# Patient Record
Sex: Female | Born: 1990 | Race: White | Hispanic: No | Marital: Married | Smoking: Never smoker
Health system: Southern US, Community
[De-identification: ages and names within clinical notes are randomized; demographics above are authoritative.]

## PROBLEM LIST (undated history)

## (undated) ENCOUNTER — Inpatient Hospital Stay (HOSPITAL_COMMUNITY): Payer: Self-pay

## (undated) DIAGNOSIS — F909 Attention-deficit hyperactivity disorder, unspecified type: Secondary | ICD-10-CM

## (undated) DIAGNOSIS — F32A Depression, unspecified: Secondary | ICD-10-CM

## (undated) DIAGNOSIS — O1205 Gestational edema, complicating the puerperium: Secondary | ICD-10-CM

## (undated) DIAGNOSIS — O139 Gestational [pregnancy-induced] hypertension without significant proteinuria, unspecified trimester: Secondary | ICD-10-CM

## (undated) DIAGNOSIS — I10 Essential (primary) hypertension: Secondary | ICD-10-CM

## (undated) DIAGNOSIS — F329 Major depressive disorder, single episode, unspecified: Secondary | ICD-10-CM

## (undated) DIAGNOSIS — K219 Gastro-esophageal reflux disease without esophagitis: Secondary | ICD-10-CM

## (undated) DIAGNOSIS — F419 Anxiety disorder, unspecified: Secondary | ICD-10-CM

## (undated) DIAGNOSIS — T8859XA Other complications of anesthesia, initial encounter: Secondary | ICD-10-CM

## (undated) DIAGNOSIS — T4145XA Adverse effect of unspecified anesthetic, initial encounter: Secondary | ICD-10-CM

## (undated) DIAGNOSIS — B977 Papillomavirus as the cause of diseases classified elsewhere: Secondary | ICD-10-CM

## (undated) DIAGNOSIS — Z8669 Personal history of other diseases of the nervous system and sense organs: Secondary | ICD-10-CM

## (undated) HISTORY — DX: Attention-deficit hyperactivity disorder, unspecified type: F90.9

## (undated) HISTORY — PX: WISDOM TOOTH EXTRACTION: SHX21

## (undated) HISTORY — PX: FINGER SURGERY: SHX640

---
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
## 2009-02-14 ENCOUNTER — Emergency Department (HOSPITAL_COMMUNITY): Admission: EM | Admit: 2009-02-14 | Discharge: 2009-02-15 | Payer: Self-pay | Admitting: Emergency Medicine

## 2010-03-09 ENCOUNTER — Ambulatory Visit (HOSPITAL_COMMUNITY): Admission: RE | Admit: 2010-03-09 | Discharge: 2010-03-09 | Payer: Self-pay | Admitting: Family Medicine

## 2010-09-26 ENCOUNTER — Encounter: Payer: Self-pay | Admitting: Gastroenterology

## 2010-12-13 LAB — COMPREHENSIVE METABOLIC PANEL
ALT: 12 U/L (ref 0–35)
AST: 20 U/L (ref 0–37)
Albumin: 3.4 g/dL — ABNORMAL LOW (ref 3.5–5.2)
Alkaline Phosphatase: 45 U/L (ref 39–117)
CO2: 26 mEq/L (ref 19–32)
Chloride: 105 mEq/L (ref 96–112)
Glucose, Bld: 102 mg/dL — ABNORMAL HIGH (ref 70–99)
Total Bilirubin: 0.7 mg/dL (ref 0.3–1.2)
Total Protein: 5.7 g/dL — ABNORMAL LOW (ref 6.0–8.3)

## 2010-12-13 LAB — DIFFERENTIAL
Basophils Absolute: 0 10*3/uL (ref 0.0–0.1)
Lymphocytes Relative: 21 % (ref 12–46)
Lymphs Abs: 0.8 10*3/uL (ref 0.7–4.0)

## 2010-12-13 LAB — CBC
Hemoglobin: 13.6 g/dL (ref 12.0–15.0)
MCHC: 36.1 g/dL — ABNORMAL HIGH (ref 30.0–36.0)
MCV: 86.9 fL (ref 78.0–100.0)
Platelets: 105 10*3/uL — ABNORMAL LOW (ref 150–400)
RBC: 4.34 MIL/uL (ref 3.87–5.11)

## 2010-12-13 LAB — URINALYSIS, ROUTINE W REFLEX MICROSCOPIC
Bilirubin Urine: NEGATIVE
Hgb urine dipstick: NEGATIVE
Ketones, ur: NEGATIVE mg/dL
Nitrite: NEGATIVE
Specific Gravity, Urine: 1.01 (ref 1.005–1.030)
Urobilinogen, UA: 0.2 mg/dL (ref 0.0–1.0)

## 2010-12-13 LAB — LIPASE, BLOOD: Lipase: 22 U/L (ref 11–59)

## 2010-12-13 LAB — SEDIMENTATION RATE: Sed Rate: 1 mm/hr (ref 0–22)

## 2013-05-14 ENCOUNTER — Emergency Department (HOSPITAL_COMMUNITY)
Admission: EM | Admit: 2013-05-14 | Discharge: 2013-05-15 | Disposition: A | Discharge status: Home / Self Care | Payer: BC Managed Care – PPO | Attending: Emergency Medicine | Admitting: Emergency Medicine

## 2013-05-14 ENCOUNTER — Encounter (HOSPITAL_COMMUNITY): Payer: Self-pay | Admitting: Emergency Medicine

## 2013-05-14 DIAGNOSIS — R51 Headache: Secondary | ICD-10-CM | POA: Insufficient documentation

## 2013-05-14 DIAGNOSIS — Z859 Personal history of malignant neoplasm, unspecified: Secondary | ICD-10-CM | POA: Insufficient documentation

## 2013-05-14 DIAGNOSIS — T148XXA Other injury of unspecified body region, initial encounter: Secondary | ICD-10-CM

## 2013-05-14 DIAGNOSIS — R233 Spontaneous ecchymoses: Secondary | ICD-10-CM | POA: Insufficient documentation

## 2013-05-14 DIAGNOSIS — Y929 Unspecified place or not applicable: Secondary | ICD-10-CM | POA: Insufficient documentation

## 2013-05-14 DIAGNOSIS — F411 Generalized anxiety disorder: Secondary | ICD-10-CM | POA: Insufficient documentation

## 2013-05-14 DIAGNOSIS — Z79899 Other long term (current) drug therapy: Secondary | ICD-10-CM | POA: Insufficient documentation

## 2013-05-14 DIAGNOSIS — X58XXXA Exposure to other specified factors, initial encounter: Secondary | ICD-10-CM | POA: Insufficient documentation

## 2013-05-14 DIAGNOSIS — S7010XA Contusion of unspecified thigh, initial encounter: Secondary | ICD-10-CM | POA: Insufficient documentation

## 2013-05-14 DIAGNOSIS — Y9389 Activity, other specified: Secondary | ICD-10-CM | POA: Insufficient documentation

## 2013-05-14 DIAGNOSIS — Z3202 Encounter for pregnancy test, result negative: Secondary | ICD-10-CM | POA: Insufficient documentation

## 2013-05-14 DIAGNOSIS — Z8619 Personal history of other infectious and parasitic diseases: Secondary | ICD-10-CM | POA: Insufficient documentation

## 2013-05-14 HISTORY — DX: Anxiety disorder, unspecified: F41.9

## 2013-05-14 HISTORY — DX: Papillomavirus as the cause of diseases classified elsewhere: B97.7

## 2013-05-14 NOTE — ED Notes (Signed)
Pt arrived to ED with a com,plaint of bruising to the upper legs.  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  Pt recently started Ativan.  Pt states that last night she fell dizzy after being weak and lethargic all weekend.

## 2013-05-14 NOTE — ED Provider Notes (Signed)
CSN: [id number]     Arrival date & time 05/14/13  2202 History  This chart was scribed for non-physician practitioner, Ivonne Andrew, PA-C, working with Lyanne Co, MD by Ronal Fear, ED scribe. This patient was seen in room WTR8/WTR8 and the patient's care was started at 11:30 PM.    Chief Complaint  Patient presents with  . Skin change     The history is provided by the patient. No language interpreter was used.    HPI Comments: Tanya Gross is a 22 y.o. female who presents to the Emergency Department complaining of skin changes to her bilateral interior thighs that occurred for no apparent reason today. Pt also complains of extreme migraines for the past 4 days in a row she is not taking any medication for them.  Pt has been urinating normally and LNMP was normal. Pt had not been lifting anything. she was not hit by anything. she did not have anything heavy on her lap for long periods of time, and no heavy running. Pt denies blood in urine, fever, chills, or sweats.  Pt takes ativan for anxiety 3 x a day and has been taking the medication for 2 weeks. Pt denies family history of bleeding. Pt is a Runner, broadcasting/film/video.   PCP: N/A Past Medical History  Diagnosis Date  . Anxiety   . HPV in female   . Cancer    Past Surgical History  Procedure Laterality Date  . Finger surgery    . Wisdom tooth extraction     History reviewed. No pertinent family history. History  Substance Use Topics  . Smoking status: Never Smoker   . Smokeless tobacco: Not on file  . Alcohol Use: Yes   OB History   Grav Para Term Preterm Abortions TAB SAB Ect Mult Living                 Review of Systems  Constitutional: Negative for fever, chills and diaphoresis.  Genitourinary: Negative for dysuria, hematuria and difficulty urinating.  Skin: Positive for color change.  Neurological: Positive for headaches.  All other systems reviewed and are negative.    Allergies  Cefdinir  Home Medications   Current  Outpatient Rx  Name  Route  Sig  Dispense  Refill  . Biotin 1 MG CAPS   Oral   Take 1 capsule by mouth daily.         Marland Kitchen ibuprofen (ADVIL,MOTRIN) 200 MG tablet   Oral   Take 400 mg by mouth every 6 (six) hours as needed for pain (pain).         . LORazepam (ATIVAN) 0.5 MG tablet   Oral   Take 0.5 mg by mouth every 6 (six) hours as needed for anxiety (anxiety).          BP 114/87  Pulse 86  Temp(Src) 98.4 F (36.9 C) (Oral)  Resp 16  SpO2 100%  LMP 05/10/2013 Physical Exam  Nursing note and vitals reviewed. Constitutional: She is oriented to person, place, and time. She appears well-developed and well-nourished. No distress.  HENT:  Head: Normocephalic and atraumatic.  Eyes: Conjunctivae and EOM are normal. No scleral icterus.  Neck: Neck supple. No tracheal deviation present.  Cardiovascular: Normal rate.   Pulmonary/Chest: Effort normal. No respiratory distress. She has no wheezes. She has no rales.  Abdominal: Soft.  Musculoskeletal: Normal range of motion. She exhibits no edema.  Neurological: She is alert and oriented to person, place, and time.  Skin: Skin  is warm and dry. No erythema.  Areas of bruising and petechiae to bilateral anterior thighs.  No hematoma or pain.  No deformity. Normal distal pulses and sensations.  Psychiatric: She has a normal mood and affect. Her behavior is normal.    ED Course  Procedures   DIAGNOSTIC STUDIES: Oxygen Saturation is 100% on RA, normal by my interpretation.    COORDINATION OF CARE: 11:44 PM- Pt advised of plan for treatment including blood test and urinalysis   Results for orders placed during the hospital encounter of 05/14/13  CBC WITH DIFFERENTIAL      Result Value Range   WBC 6.5  4.0 - 10.5 K/uL   RBC 5.16 (*) 3.87 - 5.11 MIL/uL   Hemoglobin 15.8 (*) 12.0 - 15.0 g/dL   HCT 45.4  09.8 - 11.9 %   MCV 84.3  78.0 - 100.0 fL   MCH 30.6  26.0 - 34.0 pg   MCHC 36.3 (*) 30.0 - 36.0 g/dL   RDW 14.7  82.9 - 56.2 %    Platelets 175  150 - 400 K/uL   Neutrophils Relative % 47  43 - 77 %   Neutro Abs 3.0  1.7 - 7.7 K/uL   Lymphocytes Relative 44  12 - 46 %   Lymphs Abs 2.9  0.7 - 4.0 K/uL   Monocytes Relative 8  3 - 12 %   Monocytes Absolute 0.5  0.1 - 1.0 K/uL   Eosinophils Relative 1  0 - 5 %   Eosinophils Absolute 0.1  0.0 - 0.7 K/uL   Basophils Relative 1  0 - 1 %   Basophils Absolute 0.0  0.0 - 0.1 K/uL  COMPREHENSIVE METABOLIC PANEL      Result Value Range   Sodium 135  135 - 145 mEq/L   Potassium 3.7  3.5 - 5.1 mEq/L   Chloride 99  96 - 112 mEq/L   CO2 28  19 - 32 mEq/L   Glucose, Bld 92  70 - 99 mg/dL   BUN 10  6 - 23 mg/dL   Creatinine, Ser 1.30  0.50 - 1.10 mg/dL   Calcium 9.9  8.4 - 86.5 mg/dL   Total Protein 7.6  6.0 - 8.3 g/dL   Albumin 4.5  3.5 - 5.2 g/dL   AST 15  0 - 37 U/L   ALT 12  0 - 35 U/L   Alkaline Phosphatase 59  39 - 117 U/L   Total Bilirubin 0.5  0.3 - 1.2 mg/dL   GFR calc non Af Amer >90  >90 mL/min   GFR calc Af Amer >90  >90 mL/min  PROTIME-INR      Result Value Range   Prothrombin Time 12.8  11.6 - 15.2 seconds   INR 0.98  0.00 - 1.49  POCT PREGNANCY, URINE      Result Value Range   Preg Test, Ur NEGATIVE  NEGATIVE          MDM   1. Bruising   2. Petechiae     Pt seen and evaluated.  Pt appears well no other complaints.  Initially pt did not wish to have any blood testing but changed her mind.    Labs unremarkable. Does not appear to be any string signs for her symptoms of bruising to. Patient advised continue to monitor areas and followup with PCP.   I personally performed the services described in this documentation, which was scribed in my presence. The recorded information has been  reviewed and is accurate.   Angus Seller, PA-C 05/15/13 [phone]

## 2013-05-15 ENCOUNTER — Encounter (HOSPITAL_COMMUNITY): Payer: Self-pay | Admitting: Emergency Medicine

## 2013-05-15 LAB — CBC WITH DIFFERENTIAL/PLATELET
Basophils Relative: 1 % (ref 0–1)
Eosinophils Relative: 1 % (ref 0–5)
HCT: 43.5 % (ref 36.0–46.0)
Lymphocytes Relative: 44 % (ref 12–46)
Lymphs Abs: 2.9 10*3/uL (ref 0.7–4.0)
MCH: 30.6 pg (ref 26.0–34.0)
MCV: 84.3 fL (ref 78.0–100.0)
Monocytes Absolute: 0.5 10*3/uL (ref 0.1–1.0)
Monocytes Relative: 8 % (ref 3–12)
RDW: 11.8 % (ref 11.5–15.5)
WBC: 6.5 10*3/uL (ref 4.0–10.5)

## 2013-05-15 LAB — COMPREHENSIVE METABOLIC PANEL
Albumin: 4.5 g/dL (ref 3.5–5.2)
Alkaline Phosphatase: 59 U/L (ref 39–117)
CO2: 28 mEq/L (ref 19–32)
Creatinine, Ser: 0.62 mg/dL (ref 0.50–1.10)
GFR calc Af Amer: >90 mL/min (ref >90)
GFR calc non Af Amer: >90 mL/min (ref >90)
Glucose, Bld: 92 mg/dL (ref 70–99)
Potassium: 3.7 mEq/L (ref 3.5–5.1)

## 2013-05-15 LAB — PROTIME-INR
INR: 0.98 (ref 0.00–1.49)
Prothrombin Time: 12.8 seconds (ref 11.6–15.2)

## 2013-05-15 LAB — POCT PREGNANCY, URINE: Preg Test, Ur: NEGATIVE

## 2013-05-15 NOTE — BH Assessment (Signed)
Pt has just advised [place] that she was out of the country recently in [place]

## 2013-05-15 NOTE — ED Provider Notes (Signed)
Medical screening examination/treatment/procedure(s) were performed by non-physician practitioner and as supervising physician I was immediately available for consultation/collaboration.  Cleland Simkins M Jodeci Roarty, MD 05/15/13 0855 

## 2015-02-05 ENCOUNTER — Emergency Department (HOSPITAL_BASED_OUTPATIENT_CLINIC_OR_DEPARTMENT_OTHER): Payer: BC Managed Care – PPO

## 2015-02-05 ENCOUNTER — Emergency Department (HOSPITAL_BASED_OUTPATIENT_CLINIC_OR_DEPARTMENT_OTHER)
Admission: EM | Admit: 2015-02-05 | Discharge: 2015-02-05 | Disposition: A | Discharge status: Home / Self Care | Payer: BC Managed Care – PPO | Attending: Emergency Medicine | Admitting: Emergency Medicine

## 2015-02-05 DIAGNOSIS — M25561 Pain in right knee: Secondary | ICD-10-CM

## 2015-02-05 DIAGNOSIS — Z8619 Personal history of other infectious and parasitic diseases: Secondary | ICD-10-CM | POA: Diagnosis not present

## 2015-02-05 DIAGNOSIS — Z859 Personal history of malignant neoplasm, unspecified: Secondary | ICD-10-CM | POA: Insufficient documentation

## 2015-02-05 DIAGNOSIS — F419 Anxiety disorder, unspecified: Secondary | ICD-10-CM | POA: Diagnosis not present

## 2015-02-05 DIAGNOSIS — Z79899 Other long term (current) drug therapy: Secondary | ICD-10-CM | POA: Insufficient documentation

## 2015-02-05 MED ORDER — HYDROCODONE-ACETAMINOPHEN 5-325 MG PO TABS: 1.0000 | ORAL_TABLET | ORAL | Status: DC | PRN

## 2015-02-05 MED ORDER — HYDROCODONE-ACETAMINOPHEN 5-325 MG PO TABS
2.0000 | ORAL_TABLET | Freq: Once | ORAL | Status: AC
  Administered 2015-02-05: 2 via ORAL
  Filled 2015-02-05: qty 2

## 2015-02-05 NOTE — ED Provider Notes (Signed)
CSN: [id number]     Arrival date & time 02/05/15  1544 History   First MD Initiated Contact with Patient 02/05/15 1600     Chief Complaint  Patient presents with  . Knee Pain     (Consider location/radiation/quality/duration/timing/severity/associated sxs/prior Treatment) HPI Comments: Pt comes in with c/o right knee pain. She states that she has been having problems for about a month and has seen ortho and has a mri scheduled on 6/10. She state that even though he is in a brace she stood up today and it seemed like her knee when in 2 different directions. Feels a little better know. Denies numbness or weakness  The history is provided by the patient. No language interpreter was used.    Past Medical History  Diagnosis Date  . Anxiety   . HPV in female   . Cancer    Past Surgical History  Procedure Laterality Date  . Finger surgery    . Wisdom tooth extraction     No family history on file. History  Substance Use Topics  . Smoking status: Never Smoker   . Smokeless tobacco: Not on file  . Alcohol Use: Yes   OB History    No data available     Review of Systems  All other systems reviewed and are negative.     Allergies  Cefdinir  Home Medications   Prior to Admission medications   Medication Sig Start Date End Date Taking? Authorizing Provider  levonorgestrel-ethinyl estradiol (NORDETTE) 0.15-30 MG-MCG tablet Take 1 tablet by mouth daily.   Yes Historical Provider, MD  Biotin 1 MG CAPS Take 1 capsule by mouth daily.    Historical Provider, MD  ibuprofen (ADVIL,MOTRIN) 200 MG tablet Take 400 mg by mouth every 6 (six) hours as needed for pain (pain).    Historical Provider, MD  LORazepam (ATIVAN) 0.5 MG tablet Take 0.5 mg by mouth every 6 (six) hours as needed for anxiety (anxiety).    Historical Provider, MD   BP 132/95 mmHg  Pulse 84  Temp(Src) 98.1 F (36.7 C) (Oral)  Ht  (1.676 m)  Wt 180 lb (81.647 kg)  BMI 29.07 kg/m2  SpO2 100%  LMP  01/20/2015 Physical Exam  Constitutional: She is oriented to person, place, and time. She appears well-developed and well-nourished.  Cardiovascular: Normal rate and regular rhythm.   Pulmonary/Chest: Effort normal and breath sounds normal.  Musculoskeletal: Normal range of motion.  Generalized tenderness to the right knee. Mild swelling noted. No gross deformity  Neurological: She is alert and oriented to person, place, and time.  Skin: Skin is warm and dry.  Nursing note and vitals reviewed.   ED Course  Procedures (including critical care time) Labs Review Labs Reviewed - No data to display  Imaging Review Dg Knee Complete 4 Views Right  02/05/2015   CLINICAL DATA:  Medial knee pain for 3 weeks after doing squats at the gym. Initial encounter.  EXAM: RIGHT KNEE - COMPLETE 4+ VIEW  COMPARISON:  None.  FINDINGS: No fracture or dislocation. Joint spaces are preserved. No evidence of chondrocalcinosis. No joint effusion. Regional soft tissues appear normal.  IMPRESSION: No explanation for patient's medial knee pain.   Electronically Signed   By: Simonne Come M.D.   On: 02/05/2015 16:38     EKG Interpretation None      MDM   Final diagnoses:  Right knee pain    No acute injury noted today. Pt has immobilizer and crutches already.  Given hydrocodone for pain    Teressa LowerVrinda Brandt Chaney, NP 02/05/15 [phone]  Vanetta MuldersScott Zackowski, MD 02/05/15 2321

## 2015-02-05 NOTE — Discharge Instructions (Signed)
Have the mri as planned Knee Pain The knee is the complex joint between your thigh and your lower leg. It is made up of bones, tendons, ligaments, and cartilage. The bones that make up the knee are:  The femur in the thigh.  The tibia and fibula in the lower leg.  The patella or kneecap riding in the groove on the lower femur. CAUSES  Knee pain is a common complaint with many causes. A few of these causes are:  Injury, such as:  A ruptured ligament or tendon injury.  Torn cartilage.  Medical conditions, such as:  Gout  Arthritis  Infections  Overuse, over training, or overdoing a physical activity. Knee pain can be minor or severe. Knee pain can accompany debilitating injury. Minor knee problems often respond well to self-care measures or get well on their own. More serious injuries may need medical intervention or even surgery. SYMPTOMS The knee is complex. Symptoms of knee problems can vary widely. Some of the problems are:  Pain with movement and weight bearing.  Swelling and tenderness.  Buckling of the knee.  Inability to straighten or extend your knee.  Your knee locks and you cannot straighten it.  Warmth and redness with pain and fever.  Deformity or dislocation of the kneecap. DIAGNOSIS  Determining what is wrong may be very straight forward such as when there is an injury. It can also be challenging because of the complexity of the knee. Tests to make a diagnosis may include:  Your caregiver taking a history and doing a physical exam.  Routine X-rays can be used to rule out other problems. X-rays will not reveal a cartilage tear. Some injuries of the knee can be diagnosed by:  Arthroscopy a surgical technique by which a small video camera is inserted through tiny incisions on the sides of the knee. This procedure is used to examine and repair internal knee joint problems. Tiny instruments can be used during arthroscopy to repair the torn knee cartilage  (meniscus).  Arthrography is a radiology technique. A contrast liquid is directly injected into the knee joint. Internal structures of the knee joint then become visible on X-ray film.  An MRI scan is a non X-ray radiology procedure in which magnetic fields and a computer produce two- or three-dimensional images of the inside of the knee. Cartilage tears are often visible using an MRI scanner. MRI scans have largely replaced arthrography in diagnosing cartilage tears of the knee.  Blood work.  Examination of the fluid that helps to lubricate the knee joint (synovial fluid). This is done by taking a sample out using a needle and a syringe. TREATMENT The treatment of knee problems depends on the cause. Some of these treatments are:  Depending on the injury, proper casting, splinting, surgery, or physical therapy care will be needed.  Give yourself adequate recovery time. Do not overuse your joints. If you begin to get sore during workout routines, back off. Slow down or do fewer repetitions.  For repetitive activities such as cycling or running, maintain your strength and nutrition.  Alternate muscle groups. For example, if you are a weight lifter, work the upper body on one day and the lower body the next.  Either tight or weak muscles do not give the proper support for your knee. Tight or weak muscles do not absorb the stress placed on the knee joint. Keep the muscles surrounding the knee strong.  Take care of mechanical problems.  If you have flat feet, orthotics  or special shoes may help. See your caregiver if you need help.  Arch supports, sometimes with wedges on the inner or outer aspect of the heel, can help. These can shift pressure away from the side of the knee most bothered by osteoarthritis.  A brace called an "unloader" brace also may be used to help ease the pressure on the most arthritic side of the knee.  If your caregiver has prescribed crutches, braces, wraps or ice,  use as directed. The acronym for this is PRICE. This means protection, rest, ice, compression, and elevation.  Nonsteroidal anti-inflammatory drugs (NSAIDs), can help relieve pain. But if taken immediately after an injury, they may actually increase swelling. Take NSAIDs with food in your stomach. Stop them if you develop stomach problems. Do not take these if you have a history of ulcers, stomach pain, or bleeding from the bowel. Do not take without your caregiver's approval if you have problems with fluid retention, heart failure, or kidney problems.  For ongoing knee problems, physical therapy may be helpful.  Glucosamine and chondroitin are over-the-counter dietary supplements. Both may help relieve the pain of osteoarthritis in the knee. These medicines are different from the usual anti-inflammatory drugs. Glucosamine may decrease the rate of cartilage destruction.  Injections of a corticosteroid drug into your knee joint may help reduce the symptoms of an arthritis flare-up. They may provide pain relief that lasts a few months. You may have to wait a few months between injections. The injections do have a small increased risk of infection, water retention, and elevated blood sugar levels.  Hyaluronic acid injected into damaged joints may ease pain and provide lubrication. These injections may work by reducing inflammation. A series of shots may give relief for as long as 6 months.  Topical painkillers. Applying certain ointments to your skin may help relieve the pain and stiffness of osteoarthritis. Ask your pharmacist for suggestions. Many over the-counter products are approved for temporary relief of arthritis pain.  In some countries, doctors often prescribe topical NSAIDs for relief of chronic conditions such as arthritis and tendinitis. A review of treatment with NSAID creams found that they worked as well as oral medications but without the serious side effects. PREVENTION  Maintain a  healthy weight. Extra pounds put more strain on your joints.  Get strong, stay limber. Weak muscles are a common cause of knee injuries. Stretching is important. Include flexibility exercises in your workouts.  Be smart about exercise. If you have osteoarthritis, chronic knee pain or recurring injuries, you may need to change the way you exercise. This does not mean you have to stop being active. If your knees ache after jogging or playing basketball, consider switching to swimming, water aerobics, or other low-impact activities, at least for a few days a week. Sometimes limiting high-impact activities will provide relief.  Make sure your shoes fit well. Choose footwear that is right for your sport.  Protect your knees. Use the proper gear for knee-sensitive activities. Use kneepads when playing volleyball or laying carpet. Buckle your seat belt every time you drive. Most shattered kneecaps occur in car accidents.  Rest when you are tired. SEEK MEDICAL CARE IF:  You have knee pain that is continual and does not seem to be getting better.  SEEK IMMEDIATE MEDICAL CARE IF:  Your knee joint feels hot to the touch and you have a high fever. MAKE SURE YOU:   Understand these instructions.  Will watch your condition.  Will get help right  away if you are not doing well or get worse. Document Released: 06/19/2007 Document Revised: 11/14/2011 Document Reviewed: 06/19/2007 [facility] CenterExitCare Patient Information 2015 [employer], [place]. This information is not intended to replace advice given to you by your health care provider. Make sure you discuss any questions you have with your health care provider.

## 2015-02-05 NOTE — ED Notes (Signed)
Pt to triage in w/c, reports re-injuring her right knee today. Has apt for mri to r/o meniscus tear on 6/10.

## 2015-05-03 ENCOUNTER — Encounter (HOSPITAL_BASED_OUTPATIENT_CLINIC_OR_DEPARTMENT_OTHER): Payer: Self-pay | Admitting: *Deleted

## 2015-05-03 ENCOUNTER — Emergency Department (HOSPITAL_BASED_OUTPATIENT_CLINIC_OR_DEPARTMENT_OTHER)
Admission: EM | Admit: 2015-05-03 | Discharge: 2015-05-03 | Disposition: A | Discharge status: Home / Self Care | Payer: BC Managed Care – PPO | Attending: Emergency Medicine | Admitting: Emergency Medicine

## 2015-05-03 DIAGNOSIS — Y998 Other external cause status: Secondary | ICD-10-CM | POA: Diagnosis not present

## 2015-05-03 DIAGNOSIS — F419 Anxiety disorder, unspecified: Secondary | ICD-10-CM | POA: Insufficient documentation

## 2015-05-03 DIAGNOSIS — Z859 Personal history of malignant neoplasm, unspecified: Secondary | ICD-10-CM | POA: Insufficient documentation

## 2015-05-03 DIAGNOSIS — Y9389 Activity, other specified: Secondary | ICD-10-CM | POA: Insufficient documentation

## 2015-05-03 DIAGNOSIS — Y278XXA Contact with other hot objects, undetermined intent, initial encounter: Secondary | ICD-10-CM | POA: Insufficient documentation

## 2015-05-03 DIAGNOSIS — Y9289 Other specified places as the place of occurrence of the external cause: Secondary | ICD-10-CM | POA: Diagnosis not present

## 2015-05-03 DIAGNOSIS — Z8619 Personal history of other infectious and parasitic diseases: Secondary | ICD-10-CM | POA: Diagnosis not present

## 2015-05-03 DIAGNOSIS — Z79899 Other long term (current) drug therapy: Secondary | ICD-10-CM | POA: Diagnosis not present

## 2015-05-03 DIAGNOSIS — T23202A Burn of second degree of left hand, unspecified site, initial encounter: Secondary | ICD-10-CM

## 2015-05-03 DIAGNOSIS — T23002A Burn of unspecified degree of left hand, unspecified site, initial encounter: Secondary | ICD-10-CM | POA: Diagnosis present

## 2015-05-03 MED ORDER — OXYCODONE-ACETAMINOPHEN 5-325 MG PO TABS
2.0000 | ORAL_TABLET | Freq: Once | ORAL | Status: AC
  Administered 2015-05-03: 2 via ORAL
  Filled 2015-05-03: qty 2

## 2015-05-03 MED ORDER — SILVER SULFADIAZINE 1 % EX CREA
TOPICAL_CREAM | CUTANEOUS | Status: AC
  Administered 2015-05-03: 1
  Filled 2015-05-03: qty 85

## 2015-05-03 MED ORDER — IBUPROFEN 800 MG PO TABS
800.0000 mg | ORAL_TABLET | Freq: Once | ORAL | Status: AC
  Administered 2015-05-03: 800 mg via ORAL
  Filled 2015-05-03: qty 1

## 2015-05-03 NOTE — Discharge Instructions (Signed)
Burn Care Your skin is a natural barrier to infection. It is the largest organ of your body. Burns damage this natural protection. To help prevent infection, it is very important to follow your caregiver's instructions in the care of your burn. Burns are classified as:  First degree. There is only redness of the skin (erythema). No scarring is expected.  Second degree. There is blistering of the skin. Scarring may occur with deeper burns.  Third degree. All layers of the skin are injured, and scarring is expected. HOME CARE INSTRUCTIONS   Wash your hands well before changing your bandage.  Change your bandage as often as directed by your caregiver.  Remove the old bandage. If the bandage sticks, you may soak it off with cool, clean water.  Cleanse the burn thoroughly but gently with mild soap and water.  Pat the area dry with a clean, dry cloth.  Apply a thin layer of antibacterial cream to the burn.  Apply a clean bandage as instructed by your caregiver.  Keep the bandage as clean and dry as possible.  Elevate the affected area for the first 24 hours, then as instructed by your caregiver.  Only take over-the-counter or prescription medicines for pain, discomfort, or fever as directed by your caregiver. SEEK IMMEDIATE MEDICAL CARE IF:   You develop excessive pain.  You develop redness, tenderness, swelling, or red streaks near the burn.  The burned area develops yellowish-white fluid (pus) or a bad smell.  You have a fever. MAKE SURE YOU:   Understand these instructions.  Will watch your condition.  Will get help right away if you are not doing well or get worse. Document Released: 08/22/2005 Document Revised: 11/14/2011 Document Reviewed: 01/12/2011 [employer] Patient Information 2015 [employer]. This information is not intended to replace advice given to you by your health care provider. Make sure you discuss any questions you have with your health care  provider.  

## 2015-05-03 NOTE — ED Provider Notes (Signed)
CSN: [id number]     Arrival date & time 05/03/15  1548 History  This chart was scribed for Margarita Grizzle, MD by Lyndel Safe, ED Scribe. This patient was seen in room MH04/MH04 and the patient's care was started 4:04 PM.   Chief Complaint  Patient presents with  . Hand Burn   Patient is a 24 y.o. female presenting with burn. The history is provided by the patient. No language interpreter was used.  Burn Burn location:  Hand Hand burn location:  L palm Burn quality:  Painful, intact blister and ruptured blister Time since incident:  30 minutes Progression:  Unchanged Pain details:    Severity:  Moderate   Duration:  30 minutes   Timing:  Constant   Progression:  Unchanged Mechanism of burn:  Hot surface Incident location:  Home Relieved by:  Running affected area under water Worsened by:  Tactile pressure Ineffective treatments:  NSAIDs Tetanus status:  Up to date  HPI Comments: Tanya Gross is a 24 y.o. female who presents to the Emergency Department complaining of  sudden onset, constant, moderate pain to left palm s/p burn that occurred 30 minutes ago. There are associated blisters to palm of left hand. Pt reports she grabbed her curling iron wand with her left hand approximately 30 minutes ago. She notes the wand was set to 400F. She has taken ibuprofen with no relief. Pt holding affected area under running water alleviates her pain mildly. Pt is right handed. Tetanus UTD. Pt followed by a PCP in [place], Dr. Renard Matter. Allergy to Onicef. Otherwise no allergies.   Past Medical History  Diagnosis Date  . Anxiety   . HPV in female   . Cancer    Past Surgical History  Procedure Laterality Date  . Finger surgery    . Wisdom tooth extraction     No family history on file. Social History  Substance Use Topics  . Smoking status: Never Smoker   . Smokeless tobacco: None  . Alcohol Use: No   OB History    No data available     Review of Systems  Skin: Positive for  wound ( blisters to left hand).  All other systems reviewed and are negative.  Allergies  Cefdinir  Home Medications   Prior to Admission medications   Medication Sig Start Date End Date Taking? Authorizing Provider  Biotin 1 MG CAPS Take 1 capsule by mouth daily.    Historical Provider, MD  HYDROcodone-acetaminophen (NORCO/VICODIN) 5-325 MG per tablet Take 1-2 tablets by mouth every 4 (four) hours as needed. 02/05/15   Teressa Lower, NP  ibuprofen (ADVIL,MOTRIN) 200 MG tablet Take 400 mg by mouth every 6 (six) hours as needed for pain (pain).    Historical Provider, MD  levonorgestrel-ethinyl estradiol (NORDETTE) 0.15-30 MG-MCG tablet Take 1 tablet by mouth daily.    Historical Provider, MD  LORazepam (ATIVAN) 0.5 MG tablet Take 0.5 mg by mouth every 6 (six) hours as needed for anxiety (anxiety).    Historical Provider, MD   BP 124/93 mmHg  Pulse 100  Temp(Src) 98.9 F (37.2 C) (Oral)  Resp 18  Ht  (1.676 m)  Wt 170 lb (77.111 kg)  BMI 27.45 kg/m2  SpO2 98%  LMP 04/19/2015 Physical Exam  Constitutional: She appears well-developed and well-nourished.  HENT:  Head: Normocephalic.  Eyes: Pupils are equal, round, and reactive to light.  Neck: Normal range of motion.  Pulmonary/Chest: Effort normal.  Musculoskeletal:  Hands: Small areas of blistering left palm with some surrounding erythema noncircumferential, does not cross joints  Nursing note and vitals reviewed.   ED Course  Procedures  DIAGNOSTIC STUDIES: Oxygen Saturation is 98% on RA, normal by my interpretation.    COORDINATION OF CARE: 4:08 PM Discussed treatment plan which includes to clean and dress wound with pt. Will have pt follow up with PCP. Pt acknowledges and agrees to plan.   Labs Review Labs Reviewed - No data to display  Imaging Review No results found. I have personally reviewed and evaluated these images and lab results as part of my medical decision-making.   EKG  Interpretation None      MDM   Final diagnoses:  Burn of hand, left, second degree, initial encounter    I personally performed the services described in this documentation, which was scribed in my presence. The recorded information has been reviewed and considered.   Margarita Grizzle, MD 05/03/15 [phone]

## 2015-05-03 NOTE — ED Notes (Signed)
Silver Sulfadiazine Cream applied via sterile tech to left hand, pt tolerated well, vaseline guaze applied to left hand and digits also then kerlix applied over left hand and digits per EDP orders.

## 2015-05-03 NOTE — ED Notes (Signed)
Presents with recent burn to Left Hand from curling iron, hand white in color, several closed blisters noted, pt states pain is more at left thumb,

## 2015-05-03 NOTE — ED Notes (Signed)
Pt burned her left hand on a hair iron apprx. 30 min PTA. Blistering noted.

## 2015-05-03 NOTE — ED Notes (Signed)
MD at bedside. 

## 2016-08-05 ENCOUNTER — Encounter (HOSPITAL_COMMUNITY): Payer: Self-pay | Admitting: *Deleted

## 2016-08-05 ENCOUNTER — Inpatient Hospital Stay (HOSPITAL_COMMUNITY)
Admission: AD | Admit: 2016-08-05 | Discharge: 2016-08-05 | Disposition: A | Discharge status: Home / Self Care | Payer: BC Managed Care – PPO | Source: Ambulatory Visit | Attending: Obstetrics & Gynecology | Admitting: Obstetrics & Gynecology

## 2016-08-05 DIAGNOSIS — R51 Headache: Secondary | ICD-10-CM | POA: Diagnosis not present

## 2016-08-05 DIAGNOSIS — Z3A29 29 weeks gestation of pregnancy: Secondary | ICD-10-CM | POA: Diagnosis not present

## 2016-08-05 DIAGNOSIS — O26893 Other specified pregnancy related conditions, third trimester: Secondary | ICD-10-CM | POA: Diagnosis not present

## 2016-08-05 DIAGNOSIS — O10919 Unspecified pre-existing hypertension complicating pregnancy, unspecified trimester: Secondary | ICD-10-CM

## 2016-08-05 DIAGNOSIS — O10013 Pre-existing essential hypertension complicating pregnancy, third trimester: Secondary | ICD-10-CM | POA: Diagnosis not present

## 2016-08-05 HISTORY — DX: Personal history of other diseases of the nervous system and sense organs: Z86.69

## 2016-08-05 HISTORY — DX: Essential (primary) hypertension: I10

## 2016-08-05 LAB — COMPREHENSIVE METABOLIC PANEL
ALBUMIN: 3.3 g/dL — AB (ref 3.5–5.0)
ALK PHOS: 116 U/L (ref 38–126)
ALT: 13 U/L — ABNORMAL LOW (ref 14–54)
ANION GAP: 9 (ref 5–15)
AST: 17 U/L (ref 15–41)
BILIRUBIN TOTAL: 0.7 mg/dL (ref 0.3–1.2)
BUN: 8 mg/dL (ref 6–20)
CALCIUM: 8.9 mg/dL (ref 8.9–10.3)
CO2: 19 mmol/L — ABNORMAL LOW (ref 22–32)
Chloride: 106 mmol/L (ref 101–111)
Creatinine, Ser: 0.49 mg/dL (ref 0.44–1.00)
Glucose, Bld: 76 mg/dL (ref 65–99)
POTASSIUM: 3.4 mmol/L — AB (ref 3.5–5.1)
Sodium: 134 mmol/L — ABNORMAL LOW (ref 135–145)
TOTAL PROTEIN: 6.7 g/dL (ref 6.5–8.1)

## 2016-08-05 LAB — URINALYSIS, ROUTINE W REFLEX MICROSCOPIC
Bilirubin Urine: NEGATIVE
GLUCOSE, UA: NEGATIVE mg/dL
Ketones, ur: >80 mg/dL — AB
Nitrite: NEGATIVE
PH: 5.5 (ref 5.0–8.0)
PROTEIN: NEGATIVE mg/dL
Specific Gravity, Urine: 1.02 (ref 1.005–1.030)

## 2016-08-05 LAB — CBC
HCT: 39.4 % (ref 36.0–46.0)
Hemoglobin: 14.4 g/dL (ref 12.0–15.0)
MCH: 31.3 pg (ref 26.0–34.0)
MCHC: 36.5 g/dL — ABNORMAL HIGH (ref 30.0–36.0)
MCV: 85.7 fL (ref 78.0–100.0)
Platelets: 172 10*3/uL (ref 150–400)
RBC: 4.6 MIL/uL (ref 3.87–5.11)
RDW: 13.6 % (ref 11.5–15.5)
WBC: 9.7 10*3/uL (ref 4.0–10.5)

## 2016-08-05 LAB — URINE MICROSCOPIC-ADD ON: RBC / HPF: NONE SEEN RBC/hpf (ref 0–5)

## 2016-08-05 LAB — PROTEIN / CREATININE RATIO, URINE
CREATININE, URINE: 68 mg/dL
PROTEIN CREATININE RATIO: 0.15 mg/mg{creat} (ref 0.00–0.15)
TOTAL PROTEIN, URINE: 10 mg/dL

## 2016-08-05 MED ORDER — ACETAMINOPHEN 500 MG PO TABS
1000.0000 mg | ORAL_TABLET | Freq: Once | ORAL | Status: AC
  Administered 2016-08-05: 1000 mg via ORAL
  Filled 2016-08-05: qty 2

## 2016-08-05 NOTE — MAU Provider Note (Signed)
History     CSN: [id number]  Arrival date and time: 08/05/16 1709   First Provider Initiated Contact with Patient 08/05/16 1743      Chief Complaint  Patient presents with  . Hypertension   HPI Tanya Gross is a 25 y.o. G1P0 at [redacted]w[redacted]d who presents from office for [facility] evaluation. PMH significant for migraines & chronic hypertension; d/c's antihypertensives just prior to becoming pregnant. Was seen in office today by Dr. Billy Coast as a work in d/t LE swelling. DBP in office 94 today & was sent to MAU for evaluation. Reports headache since this morning. Rates pain 4/10. Feels like previous headaches. Has not treated. Occasional black spots in vision while at work, esp. If she's doing a lot of walking. This is an ongoing issue during the pregnancy. Denies epigastric pain or n/v. BLE swelling yesterday; states feet were "spilling" over her shoes. Swelling has decreased since then.  Positive fetal movement.   OB History    Gravida Para Term Preterm AB Living   1             SAB TAB Ectopic Multiple Live Births                  Past Medical History:  Diagnosis Date  . Anxiety   . HPV in female     Past Surgical History:  Procedure Laterality Date  . FINGER SURGERY    . WISDOM TOOTH EXTRACTION      No family history on file.  Social History  Substance Use Topics  . Smoking status: Never Smoker  . Smokeless tobacco: Never Used  . Alcohol use No    Allergies:  Allergies  Allergen Reactions  . Cefdinir Anaphylaxis and Hives    Prescriptions Prior to Admission  Medication Sig Dispense Refill Last Dose  . Prenatal Vit-Fe Fumarate-FA (PRENATAL MULTIVITAMIN) TABS tablet Take 1 tablet by mouth daily at 12 noon.   08/05/2016 at Unknown time  . HYDROcodone-acetaminophen (NORCO/VICODIN) 5-325 MG per tablet Take 1-2 tablets by mouth every 4 (four) hours as needed. (Patient not taking: Reported on 08/05/2016) 10 tablet 0 Not Taking at Unknown time    Review of Systems   Constitutional: Negative.   Eyes: Negative for blurred vision.  Cardiovascular: Positive for leg swelling.  Gastrointestinal: Negative.   Genitourinary: Negative.   Neurological: Positive for headaches. Negative for dizziness.   Physical Exam   Blood pressure 119/90, pulse 96, temperature 98.8 F (37.1 C), resp. rate 18.  Temp:  [98.8 F (37.1 C)] 98.8 F (37.1 C) (12/01 1730) Pulse Rate:  [91-105] 105 (12/01 1849) Resp:  [18] 18 (12/01 1730) BP: (111-127)/(79-93) 111/85 (12/01 1849)  Physical Exam  Nursing note and vitals reviewed. Constitutional: She is oriented to person, place, and time. She appears well-developed and well-nourished. No distress.  HENT:  Head: Normocephalic and atraumatic.  Eyes: Conjunctivae are normal. Right eye exhibits no discharge. Left eye exhibits no discharge. No scleral icterus.  Neck: Normal range of motion.  Cardiovascular: Normal rate, regular rhythm and normal heart sounds.   No murmur heard. Respiratory: Effort normal and breath sounds normal. No respiratory distress. She has no wheezes.  GI: Soft. There is no tenderness.  Musculoskeletal: She exhibits edema (BLE, no pitting).  Neurological: She is alert and oriented to person, place, and time. She has normal reflexes.  No clonus  Skin: Skin is warm and dry. She is not diaphoretic.  Psychiatric: She has a normal mood and affect. Her behavior  is normal. Judgment and thought content normal.   Fetal Tracing:  Baseline: 145 Variability: moderate Accelerations: 15x15 Decelerations: none  Toco: none MAU Course  Procedures Results for orders placed or performed during the hospital encounter of 08/05/16 (from the past 24 hour(s))  Urinalysis, Routine w reflex microscopic ([facility])     Status: Abnormal   Collection Time: 08/05/16  5:15 PM  Result Value Ref Range   Color, Urine YELLOW YELLOW   APPearance CLEAR CLEAR   Specific Gravity, Urine 1.020 1.005 - 1.030   pH 5.5 5.0 - 8.0    Glucose, UA NEGATIVE NEGATIVE mg/dL   Hgb urine dipstick TRACE (A) NEGATIVE   Bilirubin Urine NEGATIVE NEGATIVE   Ketones, ur >80 (A) NEGATIVE mg/dL   Protein, ur NEGATIVE NEGATIVE mg/dL   Nitrite NEGATIVE NEGATIVE   Leukocytes, UA TRACE (A) NEGATIVE  Urine microscopic-add on     Status: Abnormal   Collection Time: 08/05/16  5:15 PM  Result Value Ref Range   Squamous Epithelial / LPF 0-5 (A) NONE SEEN   WBC, UA 0-5 0 - 5 WBC/hpf   RBC / HPF NONE SEEN 0 - 5 RBC/hpf   Bacteria, UA RARE (A) NONE SEEN  Protein / creatinine ratio, urine     Status: None   Collection Time: 08/05/16  5:21 PM  Result Value Ref Range   Creatinine, Urine 68.00 mg/dL   Total Protein, Urine 10 mg/dL   Protein Creatinine Ratio 0.15 0.00 - 0.15 mg/mg[Cre]  CBC     Status: Abnormal   Collection Time: 08/05/16  5:41 PM  Result Value Ref Range   WBC 9.7 4.0 - 10.5 K/uL   RBC 4.60 3.87 - 5.11 MIL/uL   Hemoglobin 14.4 12.0 - 15.0 g/dL   HCT 16.139.4 09.636.0 - 04.546.0 %   MCV 85.7 78.0 - 100.0 fL   MCH 31.3 26.0 - 34.0 pg   MCHC 36.5 (H) 30.0 - 36.0 g/dL   RDW 40.913.6 81.111.5 - 91.415.5 %   Platelets 172 150 - 400 K/uL  Comprehensive metabolic panel     Status: Abnormal   Collection Time: 08/05/16  5:41 PM  Result Value Ref Range   Sodium 134 (L) 135 - 145 mmol/L   Potassium 3.4 (L) 3.5 - 5.1 mmol/L   Chloride 106 101 - 111 mmol/L   CO2 19 (L) 22 - 32 mmol/L   Glucose, Bld 76 65 - 99 mg/dL   BUN 8 6 - 20 mg/dL   Creatinine, Ser 7.820.49 0.44 - 1.00 mg/dL   Calcium 8.9 8.9 - 95.610.3 mg/dL   Total Protein 6.7 6.5 - 8.1 g/dL   Albumin 3.3 (L) 3.5 - 5.0 g/dL   AST 17 15 - 41 U/L   ALT 13 (L) 14 - 54 U/L   Alkaline Phosphatase 116 38 - 126 U/L   Total Bilirubin 0.7 0.3 - 1.2 mg/dL   GFR calc non Af Amer >60 >60 mL/min   GFR calc Af Amer >60 >60 mL/min   Anion gap 9 5 - 15    MDM Reactive fetal tracing DBP >90 x 2 checks; otherwise normotensive CBC, CMP, urine PCR  Tylenol 1 gm PO S/w Dr. Juliene PinaMody regarding VS & labs. Ok to  discharge home with return precautions. Pt has f/u appt on Monday in office.  Rates headache 3/10 at time of discharge Assessment and Plan  A; 1. Chronic hypertension affecting pregnancy   2. Pregnancy headache in third trimester    P: Discharge  home Strict return precautions for s/s preeclampsia Keep appt with OB on Monday Take tylenol prn headache  Tanya Gross 08/05/2016, 5:40 PM

## 2016-08-05 NOTE — Discharge Instructions (Signed)
Hypertension During Pregnancy °Hypertension, commonly called high blood pressure, is when the force of blood pumping through your arteries is too strong. Arteries are blood vessels that carry blood from the heart throughout the body. Hypertension during pregnancy can cause problems for you and your baby. Your baby may be born early (prematurely) or may not weigh as much as he or she should at birth. Very bad cases of hypertension during pregnancy can be life-threatening. °Different types of hypertension can occur during pregnancy. These include: °· Chronic hypertension. This happens when: °¨ You have hypertension before pregnancy and it continues during pregnancy. °¨ You develop hypertension before you are [redacted] weeks pregnant, and it continues during pregnancy. °· Gestational hypertension. This is hypertension that develops after the 20th week of pregnancy. °· Preeclampsia, also called toxemia of pregnancy. This is a very serious type of hypertension that develops only during pregnancy. It affects the whole body, and it can be very dangerous for you and your baby. °Gestational hypertension and preeclampsia usually go away within 6 weeks after your baby is born. Women who have hypertension during pregnancy have a greater chance of developing hypertension later in life or during future pregnancies. °What are the causes? °The exact cause of hypertension is not known. °What increases the risk? °There are certain factors that make it more likely for you to develop hypertension during pregnancy. These include: °· Having hypertension during a previous pregnancy or prior to pregnancy. °· Being overweight. °· Being older than age 40. °· Being pregnant for the first time or being pregnant with more than one baby. °· Becoming pregnant using fertilization methods such as IVF (in vitro fertilization). °· Having diabetes, kidney problems, or systemic lupus erythematosus. °· Having a family history of hypertension. °What are the  signs or symptoms? °Chronic hypertension and gestational hypertension rarely cause symptoms. Preeclampsia causes symptoms, which may include: °· Increased protein in your urine. Your health care provider will check for this at every visit before you give birth (prenatal visit). °· Severe headaches. °· Sudden weight gain. °· Swelling of the hands, face, legs, and feet. °· Nausea and vomiting. °· Vision problems, such as blurred or double vision. °· Numbness in the face, arms, legs, and feet. °· Dizziness. °· Slurred speech. °· Sensitivity to bright lights. °· Abdominal pain. °· Convulsions. °How is this diagnosed? °You may be diagnosed with hypertension during a routine prenatal exam. At each prenatal visit, you may: °· Have a urine test to check for high amounts of protein in your urine. °· Have your blood pressure checked. A blood pressure reading is recorded as two numbers, such as "120 over 80" (or 120/80). The first ("top") number is called the systolic pressure. It is a measure of the pressure in your arteries when your heart beats. The second ("bottom") number is called the diastolic pressure. It is a measure of the pressure in your arteries as your heart relaxes between beats. Blood pressure is measured in a unit called mm Hg. A normal blood pressure reading is: °¨ Systolic: below 120. °¨ Diastolic: below 80. °The type of hypertension that you are diagnosed with depends on your test results and when your symptoms developed. °· Chronic hypertension is usually diagnosed before 20 weeks of pregnancy. °· Gestational hypertension is usually diagnosed after 20 weeks of pregnancy. °· Hypertension with high amounts of protein in the urine is diagnosed as preeclampsia. °· Blood pressure measurements that stay above 160 systolic, or above 110 diastolic, are signs of severe preeclampsia. °  How is this treated? °Treatment for hypertension during pregnancy varies depending on the type of hypertension you have and how  serious it is. °· If you take medicines called ACE inhibitors to treat chronic hypertension, you may need to switch medicines. ACE inhibitors should not be taken during pregnancy. °· If you have gestational hypertension, you may need to take blood pressure medicine. °· If you are at risk for preeclampsia, your health care provider may recommend that you take a low-dose aspirin every day to prevent high blood pressure during your pregnancy. °· If you have severe preeclampsia, you may need to be hospitalized so you and your baby can be monitored closely. You may also need to take medicine (magnesium sulfate) to prevent seizures and to lower blood pressure. This medicine may be given as an injection or through an IV tube. °· In some cases, if your condition gets worse, you may need to deliver your baby early. °Follow these instructions at home: °Eating and drinking °· Drink enough fluid to keep your urine clear or pale yellow. °· Eat a healthy diet that is low in salt (sodium). Do not add salt to your food. Check food labels to see how much sodium a food or beverage contains. °Lifestyle °· Do not use any products that contain nicotine or tobacco, such as cigarettes and e-cigarettes. If you need help quitting, ask your health care provider. °· Do not use alcohol. °· Avoid caffeine. °· Avoid stress as much as possible. Rest and get plenty of sleep. °General instructions °· Take over-the-counter and prescription medicines only as told by your health care provider. °· While lying down, lie on your left side. This keeps pressure off your baby. °· While sitting or lying down, raise (elevate) your feet. Try putting some pillows under your lower legs. °· Exercise regularly. Ask your health care provider what kinds of exercise are best for you. °· Keep all prenatal and follow-up visits as told by your health care provider. This is important. °Contact a health care provider if: °· You have symptoms that your health care provider  told you may require more treatment or monitoring, such as: °¨ Fever. °¨ Vomiting. °¨ Headache. °Get help right away if: °· You have severe abdominal pain or vomiting that does not get better with treatment. °· You suddenly develop swelling in your hands, ankles, or face. °· You gain 4 lbs (1.8 kg) or more in 1 week. °· You develop vaginal bleeding, or you have blood in your urine. °· You do not feel your baby moving as much as usual. °· You have blurred or double vision. °· You have muscle twitching or sudden tightening (spasms). °· You have shortness of breath. °· Your lips or fingernails turn blue. °This information is not intended to replace advice given to you by your health care provider. Make sure you discuss any questions you have with your health care provider. °Document Released: 05/10/2011 Document Revised: 03/11/2016 Document Reviewed: 02/05/2016 °Elsevier Interactive Patient Education © 2017 [employer] ° °

## 2016-08-05 NOTE — MAU Note (Signed)
Pt sent in for elevated b/p diastolic in the 90's . C/O headache and some nausea.  Reported seeing some flashes and spots at times.Had swollen feet yesterday. Good fetal movement reported

## 2016-09-05 NOTE — L&D Delivery Note (Signed)
Delivery Note  First Stage: Labor onset: 1845 Augmentation : pitocin Analgesia /Anesthesia intrapartum: nitrous / epidural AROM at 2328  Second Stage: Complete dilation at 0511 Onset of pushing at 0530 FHR second stage category 2  Delivery of a viable female at [phone] by [name] in ROA position Single loose nuchal cord reduced over head prior to shoulders / noted true knot in cord proximal to baby Cord double clamped after cessation of pulsation, cut by [name] Cord blood sample collected   Initial newborn stimulated with weak cry and persistent purple color - to warmer for additional stimulation and O2  Third [facility]: [facility] 3 VC @ [phone] Noted [place] with partial cord avulsion / moderate calcifications / accessory lobe / main placenta bed smaller than average  Placenta disposition: pathology Uterine tone firm / bleeding small  2nd perineal laceration identified  Anesthesia for repair: epidural Repair 3-0 vicryl interrupted muscle repair with 4-0 vicryl subcuticular Est. Blood Loss (mL): 350  Complications: none  Mom to postpartum.  Baby to Couplet care / Skin to Skin.  Newborn: Birth Weight:6-12 Apgar Scores: 6-8 Feeding planned: breast  Marlinda MikeBAILEY, Tanya Gross [name], [name], [facility] 10/01/2016, 6:38 AM

## 2016-09-30 ENCOUNTER — Inpatient Hospital Stay (HOSPITAL_COMMUNITY)
Admission: AD | Admit: 2016-09-30 | Discharge: 2016-10-04 | DRG: 774 | Disposition: A | Discharge status: Home / Self Care | Payer: BC Managed Care – PPO | Source: Ambulatory Visit | Attending: Obstetrics | Admitting: Obstetrics

## 2016-09-30 ENCOUNTER — Inpatient Hospital Stay (HOSPITAL_COMMUNITY): Payer: BC Managed Care – PPO | Admitting: Anesthesiology

## 2016-09-30 ENCOUNTER — Encounter (HOSPITAL_COMMUNITY): Payer: Self-pay | Admitting: *Deleted

## 2016-09-30 DIAGNOSIS — O9912 Other diseases of the blood and blood-forming organs and certain disorders involving the immune mechanism complicating childbirth: Secondary | ICD-10-CM | POA: Diagnosis present

## 2016-09-30 DIAGNOSIS — O26899 Other specified pregnancy related conditions, unspecified trimester: Secondary | ICD-10-CM

## 2016-09-30 DIAGNOSIS — F419 Anxiety disorder, unspecified: Secondary | ICD-10-CM | POA: Diagnosis present

## 2016-09-30 DIAGNOSIS — Z3A37 37 weeks gestation of pregnancy: Secondary | ICD-10-CM

## 2016-09-30 DIAGNOSIS — O99344 Other mental disorders complicating childbirth: Secondary | ICD-10-CM | POA: Diagnosis present

## 2016-09-30 DIAGNOSIS — O1002 Pre-existing essential hypertension complicating childbirth: Principal | ICD-10-CM | POA: Diagnosis present

## 2016-09-30 DIAGNOSIS — N941 Unspecified dyspareunia: Secondary | ICD-10-CM | POA: Diagnosis present

## 2016-09-30 DIAGNOSIS — O26893 Other specified pregnancy related conditions, third trimester: Secondary | ICD-10-CM | POA: Diagnosis present

## 2016-09-30 DIAGNOSIS — F418 Other specified anxiety disorders: Secondary | ICD-10-CM | POA: Diagnosis present

## 2016-09-30 DIAGNOSIS — O1205 Gestational edema, complicating the puerperium: Secondary | ICD-10-CM | POA: Diagnosis present

## 2016-09-30 DIAGNOSIS — D6959 Other secondary thrombocytopenia: Secondary | ICD-10-CM | POA: Diagnosis present

## 2016-09-30 DIAGNOSIS — O10919 Unspecified pre-existing hypertension complicating pregnancy, unspecified trimester: Secondary | ICD-10-CM | POA: Diagnosis present

## 2016-09-30 DIAGNOSIS — D696 Thrombocytopenia, unspecified: Secondary | ICD-10-CM | POA: Diagnosis not present

## 2016-09-30 DIAGNOSIS — O43193 Other malformation of placenta, third trimester: Secondary | ICD-10-CM | POA: Diagnosis present

## 2016-09-30 DIAGNOSIS — Z6791 Unspecified blood type, Rh negative: Secondary | ICD-10-CM | POA: Diagnosis not present

## 2016-09-30 DIAGNOSIS — O133 Gestational [pregnancy-induced] hypertension without significant proteinuria, third trimester: Secondary | ICD-10-CM | POA: Diagnosis present

## 2016-09-30 DIAGNOSIS — O99119 Other diseases of the blood and blood-forming organs and certain disorders involving the immune mechanism complicating pregnancy, unspecified trimester: Secondary | ICD-10-CM

## 2016-09-30 DIAGNOSIS — F329 Major depressive disorder, single episode, unspecified: Secondary | ICD-10-CM | POA: Diagnosis present

## 2016-09-30 HISTORY — DX: Depression, unspecified: F32.A

## 2016-09-30 HISTORY — DX: Gestational edema, complicating the puerperium: O12.05

## 2016-09-30 HISTORY — DX: Major depressive disorder, single episode, unspecified: F32.9

## 2016-09-30 HISTORY — DX: Gestational (pregnancy-induced) hypertension without significant proteinuria, unspecified trimester: O13.9

## 2016-09-30 LAB — CBC
HEMATOCRIT: 41.2 % (ref 36.0–46.0)
Hemoglobin: 14.9 g/dL (ref 12.0–15.0)
MCH: 31.2 pg (ref 26.0–34.0)
MCHC: 36.2 g/dL — ABNORMAL HIGH (ref 30.0–36.0)
MCV: 86.4 fL (ref 78.0–100.0)
PLATELETS: 157 10*3/uL (ref 150–400)
RBC: 4.77 MIL/uL (ref 3.87–5.11)
RDW: 13.6 % (ref 11.5–15.5)
WBC: 9.1 10*3/uL (ref 4.0–10.5)

## 2016-09-30 LAB — OB RESULTS CONSOLE ABO/RH: RH TYPE: NEGATIVE

## 2016-09-30 LAB — OB RESULTS CONSOLE RPR: RPR: NONREACTIVE

## 2016-09-30 LAB — COMPREHENSIVE METABOLIC PANEL
ALBUMIN: 2.9 g/dL — AB (ref 3.5–5.0)
ALT: 13 U/L — ABNORMAL LOW (ref 14–54)
AST: 17 U/L (ref 15–41)
Alkaline Phosphatase: 224 U/L — ABNORMAL HIGH (ref 38–126)
Anion gap: 9 (ref 5–15)
BUN: 5 mg/dL — AB (ref 6–20)
CO2: 17 mmol/L — AB (ref 22–32)
Calcium: 8.6 mg/dL — ABNORMAL LOW (ref 8.9–10.3)
Chloride: 108 mmol/L (ref 101–111)
Creatinine, Ser: 0.5 mg/dL (ref 0.44–1.00)
GFR calc Af Amer: >60 mL/min (ref >60)
GFR calc non Af Amer: >60 mL/min (ref >60)
GLUCOSE: 85 mg/dL (ref 65–99)
POTASSIUM: 3.6 mmol/L (ref 3.5–5.1)
Sodium: 134 mmol/L — ABNORMAL LOW (ref 135–145)
Total Bilirubin: 0.5 mg/dL (ref 0.3–1.2)
Total Protein: 5.7 g/dL — ABNORMAL LOW (ref 6.5–8.1)

## 2016-09-30 LAB — OB RESULTS CONSOLE GC/CHLAMYDIA
Chlamydia: NEGATIVE
Gonorrhea: NEGATIVE

## 2016-09-30 LAB — OB RESULTS CONSOLE GBS: STREP GROUP B AG: NEGATIVE

## 2016-09-30 LAB — URIC ACID: Uric Acid, Serum: 4.7 mg/dL (ref 2.3–6.6)

## 2016-09-30 LAB — OB RESULTS CONSOLE RUBELLA ANTIBODY, IGM: Rubella: IMMUNE

## 2016-09-30 LAB — PROTEIN / CREATININE RATIO, URINE
Creatinine, Urine: 51 mg/dL
Total Protein, Urine: <6 mg/dL

## 2016-09-30 LAB — OB RESULTS CONSOLE HEPATITIS B SURFACE ANTIGEN: Hepatitis B Surface Ag: NEGATIVE

## 2016-09-30 LAB — OB RESULTS CONSOLE HIV ANTIBODY (ROUTINE TESTING): HIV: NONREACTIVE

## 2016-09-30 LAB — OB RESULTS CONSOLE ANTIBODY SCREEN: Antibody Screen: NEGATIVE

## 2016-09-30 MED ORDER — LACTATED RINGERS IV SOLN: 500.0000 mL | Freq: Once | INTRAVENOUS | Status: DC

## 2016-09-30 MED ORDER — BUTORPHANOL TARTRATE 2 MG/ML IJ SOLN: 2.0000 mg | Freq: Once | INTRAMUSCULAR | Status: DC

## 2016-09-30 MED ORDER — OXYTOCIN BOLUS FROM INFUSION
500.0000 mL | Freq: Once | INTRAVENOUS | Status: AC
  Administered 2016-10-01: 500 mL via INTRAVENOUS

## 2016-09-30 MED ORDER — EPHEDRINE 5 MG/ML INJ
10.0000 mg | INTRAVENOUS | Status: DC | PRN
Start: 2016-09-30 — End: 2016-10-01
  Filled 2016-09-30: qty 4

## 2016-09-30 MED ORDER — DIPHENHYDRAMINE HCL 50 MG/ML IJ SOLN: 12.5000 mg | INTRAMUSCULAR | Status: DC | PRN

## 2016-09-30 MED ORDER — EPHEDRINE 5 MG/ML INJ
10.0000 mg | INTRAVENOUS | Status: DC | PRN
  Filled 2016-09-30: qty 4

## 2016-09-30 MED ORDER — MISOPROSTOL 25 MCG QUARTER TABLET
25.0000 ug | ORAL_TABLET | Freq: Once | ORAL | Status: AC
  Administered 2016-09-30: 25 ug via VAGINAL
  Filled 2016-09-30: qty 0.25

## 2016-09-30 MED ORDER — OXYTOCIN 10 UNIT/ML IJ SOLN
10.0000 [IU] | Freq: Once | INTRAMUSCULAR | Status: DC | PRN
  Filled 2016-09-30: qty 1

## 2016-09-30 MED ORDER — OXYCODONE-ACETAMINOPHEN 5-325 MG PO TABS: 1.0000 | ORAL_TABLET | ORAL | Status: DC | PRN

## 2016-09-30 MED ORDER — MISOPROSTOL 50MCG HALF TABLET
50.0000 ug | ORAL_TABLET | ORAL | Status: DC
  Administered 2016-09-30: 50 ug via ORAL
  Filled 2016-09-30: qty 0.5

## 2016-09-30 MED ORDER — PHENYLEPHRINE 40 MCG/ML (10ML) SYRINGE FOR IV PUSH (FOR BLOOD PRESSURE SUPPORT)
PREFILLED_SYRINGE | INTRAVENOUS | Status: AC
  Filled 2016-09-30: qty 20

## 2016-09-30 MED ORDER — PHENYLEPHRINE 40 MCG/ML (10ML) SYRINGE FOR IV PUSH (FOR BLOOD PRESSURE SUPPORT)
80.0000 ug | PREFILLED_SYRINGE | INTRAVENOUS | Status: DC | PRN
  Filled 2016-09-30: qty 5

## 2016-09-30 MED ORDER — ACETAMINOPHEN 325 MG PO TABS
650.0000 mg | ORAL_TABLET | ORAL | Status: DC | PRN
  Administered 2016-09-30 – 2016-10-01 (×4): 650 mg via ORAL
  Filled 2016-09-30 (×4): qty 2

## 2016-09-30 MED ORDER — FENTANYL 2.5 MCG/ML BUPIVACAINE 1/10 % EPIDURAL INFUSION (WH - ANES)
14.0000 mL/h | INTRAMUSCULAR | Status: DC | PRN
  Administered 2016-09-30 – 2016-10-01 (×2): 14 mL/h via EPIDURAL
  Filled 2016-09-30 (×2): qty 100

## 2016-09-30 MED ORDER — OXYCODONE-ACETAMINOPHEN 5-325 MG PO TABS: 2.0000 | ORAL_TABLET | ORAL | Status: DC | PRN

## 2016-09-30 MED ORDER — SOD CITRATE-CITRIC ACID 500-334 MG/5ML PO SOLN: 30.0000 mL | ORAL | Status: DC | PRN

## 2016-09-30 MED ORDER — FENTANYL 2.5 MCG/ML BUPIVACAINE 1/10 % EPIDURAL INFUSION (WH - ANES)
INTRAMUSCULAR | Status: AC
  Filled 2016-09-30: qty 100

## 2016-09-30 MED ORDER — LIDOCAINE HCL (PF) 1 % IJ SOLN
30.0000 mL | INTRAMUSCULAR | Status: DC | PRN
  Filled 2016-09-30: qty 30

## 2016-09-30 MED ORDER — LACTATED RINGERS IV SOLN: 500.0000 mL | INTRAVENOUS | Status: DC | PRN

## 2016-09-30 MED ORDER — OXYTOCIN 40 UNITS IN LACTATED RINGERS INFUSION - SIMPLE MED
2.5000 [IU]/h | INTRAVENOUS | Status: DC
  Administered 2016-10-01: 2.5 [IU]/h via INTRAVENOUS
  Filled 2016-09-30: qty 1000

## 2016-09-30 MED ORDER — LACTATED RINGERS IV SOLN
INTRAVENOUS | Status: DC
  Administered 2016-09-30 (×2): via INTRAVENOUS

## 2016-09-30 MED ORDER — LIDOCAINE HCL (PF) 1 % IJ SOLN
INTRAMUSCULAR | Status: DC | PRN
  Administered 2016-09-30: 4 mL via EPIDURAL

## 2016-09-30 NOTE — Anesthesia Preprocedure Evaluation (Signed)
Anesthesia Evaluation  Patient identified by MRN, date of birth, ID band Patient awake    Reviewed: Allergy & Precautions, Patient's Chart, lab work & pertinent test results, reviewed documented beta blocker date and time   Airway Mallampati: II  TM Distance: >3 FB Neck ROM: Full    Dental  (+) Teeth Intact, Dental Advisory Given   Pulmonary neg pulmonary ROS,    breath sounds clear to auscultation       Cardiovascular hypertension, Pt. on home beta blockers  Rhythm:Regular Rate:Normal     Neuro/Psych PSYCHIATRIC DISORDERS Anxiety Depression negative neurological ROS     GI/Hepatic negative GI ROS, Neg liver ROS,   Endo/Other  diabetes, Gestational  Renal/GU negative Renal ROS  negative genitourinary   Musculoskeletal negative musculoskeletal ROS (+)   Abdominal   Peds negative pediatric ROS (+)  Hematology negative hematology ROS (+)   Anesthesia Other Findings   Reproductive/Obstetrics (+) Pregnancy                             Lab Results  Component Value Date   WBC 9.1 09/30/2016   HGB 14.9 09/30/2016   HCT 41.2 09/30/2016   MCV 86.4 09/30/2016   PLT 157 09/30/2016   Lab Results  Component Value Date   INR 0.98 05/15/2013     Anesthesia Physical Anesthesia Plan  ASA: II  Anesthesia Plan: Epidural   Post-op Pain Management:    Induction:   Airway Management Planned:   Additional Equipment:   Intra-op Plan:   Post-operative Plan:   Informed Consent: I have reviewed the patients History and Physical, chart, labs and discussed the procedure including the risks, benefits and alternatives for the proposed anesthesia with the patient or authorized representative who has indicated his/her understanding and acceptance.     Plan Discussed with:   Anesthesia Plan Comments:         Anesthesia Quick Evaluation

## 2016-09-30 NOTE — Anesthesia Procedure Notes (Signed)
Epidural Patient location during procedure: OB Start time: 09/30/2016 10:14 PM End time: 09/30/2016 10:20 PM  Staffing Anesthesiologist: Shona SimpsonHOLLIS, Hervey Wedig D Performed: anesthesiologist   Preanesthetic Checklist Completed: patient identified, site marked, surgical consent, pre-op evaluation, timeout performed, IV checked, risks and benefits discussed and monitors and equipment checked  Epidural Patient position: sitting Prep: ChloraPrep Patient monitoring: heart rate, continuous pulse ox and blood pressure Approach: midline Location: L3-L4 Injection technique: LOR saline  Needle:  Needle type: Tuohy  Needle gauge: 17 G Needle length: 9 cm Catheter type: closed end flexible Catheter size: 20 Guage Test dose: negative and 1.5% lidocaine  Assessment Events: blood not aspirated, injection not painful, no injection resistance and no paresthesia  Additional Notes LOR @ 5  Patient identified. Risks/Benefits/Options discussed with patient including but not limited to bleeding, infection, nerve damage, paralysis, failed block, incomplete pain control, headache, blood pressure changes, nausea, vomiting, reactions to medications, itching and postpartum back pain. Confirmed with bedside nurse the patient's most recent platelet count. Confirmed with patient that they are not currently taking any anticoagulation, have any bleeding history or any family history of bleeding disorders. Patient expressed understanding and wished to proceed. All questions were answered. Sterile technique was used throughout the entire procedure. Please see nursing notes for vital signs. Test dose was given through epidural catheter and negative prior to continuing to dose epidural or start infusion. Warning signs of high block given to the patient including shortness of breath, tingling/numbness in hands, complete motor block, or any concerning symptoms with instructions to call for help. Patient was given instructions on  fall risk and not to get out of bed. All questions and concerns addressed with instructions to call with any issues or inadequate analgesia.    Reason for block:procedure for pain

## 2016-09-30 NOTE — H&P (Signed)
  OB ADMISSION/ HISTORY & PHYSICAL:  Admission Date: 09/30/2016 11:17 AM  Admit Diagnosis: 37.3 weeks chronic hypertension with worsening hypertension / headache today  Tanya Gross is a 26 y.o. female presenting for labor induction  Prenatal History: G1P0000   EDC : 10/18/2016, by Other Basis  [facility]  Primary Ob Provider: Kathi LudwigBailey [name]  Prenatal course complicated by chronic hypertension, dyspareunia (severe), anxiety and depression HX, grade 3 placenta early third trimester  Prenatal Labs: ABO, Rh: AB/Negative/-- (01/26 1244) Antibody: Negative (01/26 1244) Rubella: Immune (01/26 1244)  RPR: Nonreactive (01/26 1244)  HBsAg: Negative (01/26 1244)  HIV: Non-reactive (01/26 1244)  GBS: Negative (01/26 1244)   Medical / Surgical History :  Past medical history:  Past Medical History:  Diagnosis Date  . Anxiety   . Depression   . Diabetes mellitus without complication (HCC)   . HPV in female   . Hx of migraines   . Hypertension    on meds prior to pregnancy  . Mental disorder   . Pregnancy induced hypertension      Past surgical history:  Past Surgical History:  Procedure Laterality Date  . FINGER SURGERY    . WISDOM TOOTH EXTRACTION      Family History: History reviewed. No pertinent family history.   Social History:  reports that she has never smoked. She has never used smokeless tobacco. She reports that she does not drink alcohol or use drugs.   Allergies: Cefdinir    Current Medications at time of admission:  Prior to Admission medications   Medication Sig Start Date End Date Taking? Authorizing Provider  Prenatal Vit-Fe Fumarate-FA (PRENATAL MULTIVITAMIN) TABS tablet Take 1 tablet by mouth daily at 12 noon.    Historical Provider, MD  Labetalol 100mg  TID  Review of Systems: Active FM Persistent frontal headache Reports blurred vision earlier today  Physical Exam: VS: Blood pressure 133/89, pulse (!) 106,  temperature 97.8 F (36.6 C), temperature source Oral, resp. rate 18, height 5\' 6"  (1.676 m), weight 100.2 kg (221 lb).  General: alert and oriented, appears tearful and anxious Heart: RRR Lungs: Clear lung fields Abdomen: Gravid, soft and non-tender, non-distended / uterus: gravid Extremities: 1+ edema  FHR: baseline rate 140 / variability moderate / accelerations + / no decelerations  Toco - no ctx  Assessment: 37.[redacted]  weeks gestation chronic hypertension with worsening hypertension concern for preeclampsia FHR category 1 Severe dyspareunia with inability to tolerate VE  Plan:  Admit PIH labs to evaluate for PEC - magnesium if abnormal labs Treat severe range BP IV labetalol Early epidural  Dr Billy Coastaavon notified of admission / plan of care   Marlinda MikeBAILEY, Tanya Gross [name], [name], [facility] 09/30/2016, 1:08 PM

## 2016-09-30 NOTE — Progress Notes (Signed)
S:  Comfortable with epidural after re-dose   O:  VS: Blood pressure (!) 124/94, pulse 93, temperature 98.4 F (36.9 C), temperature source Axillary, resp. rate 20, height 5\' 6"  (1.676 m), weight 100.2 kg (221 lb), SpO2 99 %.        FHR : baseline 135 / variability moderate / accelerations + / no decelerations        Toco: contractions every 2-3 minutes / moderate        Cervix : 8cm / 90% / vtx 0 station / LOT        Membranes: BBOW - AROM clear with bloody show  A: active labor     FHR category 1  P: reposition to lateral with peanut to facilitate fetal rotation and descent       anticipate SVB     Marlinda MikeBAILEY, TANYA [name], [name], [facility] 09/30/2016, 11:43 PM

## 2016-09-30 NOTE — Progress Notes (Signed)
S:  Happy with nitrous  O:  VS: Blood pressure 134/87, pulse 93, temperature 97.8 F (36.6 C), temperature source Oral, resp. rate 20, height 5\' 6"  (1.676 m), weight 100.2 kg (221 lb).        FHR : baseline 140 / variability moderate / accelerations + / no decelerations        Toco: contractions every 2 minutes    Balloon out - removed from vagina  A: active labor     FHR category 1  P: place epidural      AROM after epidural      anticipate SVB     Marlinda MikeBAILEY, Dalinda Heidt [name], [name], [facility] 09/30/2016, 9:10 PM

## 2016-09-30 NOTE — Progress Notes (Signed)
S:  Nitrous oxide for comfort  O:  VS: Blood pressure 134/87, pulse 93, temperature 97.8 F (36.6 C), temperature source Oral, resp. rate 20, height 5\' 6"  (1.676 m), weight 100.2 kg (221 lb).        FHR : baseline 140 / variability moderate / accelerations + / no decelerations        toco: contractions every 2-4 minutes / 50 sec / mild        cervix : 2cm / 70% / vtx -1        membranes: intact        cervical balloon inserted - tolerated well - inflated with 60ml - secured to inner thigh with traction         cytotec 25mcg per vagina  A: induction of labor     FHR category 1  P: expectant management      traction to balloon Q 2 hours        nitrous oxide as needed then epidural prior to AROM  Marlinda MikeBAILEY, Kelcy Baeten [name], [name], [facility] 09/30/2016, 6:45 PM

## 2016-09-30 NOTE — Progress Notes (Signed)
   09/30/16 2321  Vital Signs  BP 125/76  Pulse Rate 87  Dr. Hart RochesterHollis at bedside and epidural bolus administered.  Will continue to monitor blood pressure Q5 mins.

## 2016-09-30 NOTE — Anesthesia Pain Management Evaluation Note (Signed)
  CRNA Pain Management Visit Note  Patient: Tanya Gross, 26 y.o., female  "Hello I am a [facility] of the [facility]. We have an anesthesia team available at all times to provide care throughout the hospital, including epidural management and anesthesia for C-section. I don't know your plan for the delivery whether it a natural birth, water birth, IV sedation, nitrous supplementation, doula or epidural, but we want to meet your pain goals."   1.Was your pain managed to your expectations on prior hospitalizations?   No prior hospitalizations  2.What is your expectation for pain management during this hospitalization?     Epidural  3.How can we help you reach that goal? Epidural   Record the patient's initial score and the patient's pain goal.   Pain: 3 Pt has headache  Pain Goal: 2 [facility] wants you to be able to say your pain was always managed very well.  [facility] Orthopaedic CenterWRINKLE,[name] 09/30/2016

## 2016-10-01 ENCOUNTER — Encounter (HOSPITAL_COMMUNITY): Payer: Self-pay

## 2016-10-01 DIAGNOSIS — O10919 Unspecified pre-existing hypertension complicating pregnancy, unspecified trimester: Secondary | ICD-10-CM | POA: Diagnosis present

## 2016-10-01 DIAGNOSIS — O26899 Other specified pregnancy related conditions, unspecified trimester: Secondary | ICD-10-CM

## 2016-10-01 DIAGNOSIS — Z6791 Unspecified blood type, Rh negative: Secondary | ICD-10-CM

## 2016-10-01 DIAGNOSIS — F419 Anxiety disorder, unspecified: Secondary | ICD-10-CM | POA: Diagnosis present

## 2016-10-01 DIAGNOSIS — F329 Major depressive disorder, single episode, unspecified: Secondary | ICD-10-CM | POA: Diagnosis present

## 2016-10-01 LAB — RPR: RPR Ser Ql: NONREACTIVE

## 2016-10-01 MED ORDER — FLUOXETINE HCL 20 MG PO CAPS
20.0000 mg | ORAL_CAPSULE | Freq: Every day | ORAL | Status: DC
  Administered 2016-10-01 – 2016-10-03 (×3): 20 mg via ORAL
  Filled 2016-10-01 (×3): qty 1

## 2016-10-01 MED ORDER — SIMETHICONE 80 MG PO CHEW: 80.0000 mg | CHEWABLE_TABLET | ORAL | Status: DC | PRN

## 2016-10-01 MED ORDER — LABETALOL HCL 100 MG PO TABS
100.0000 mg | ORAL_TABLET | Freq: Three times a day (TID) | ORAL | Status: DC
  Administered 2016-10-01 – 2016-10-04 (×8): 100 mg via ORAL
  Filled 2016-10-01 (×8): qty 1

## 2016-10-01 MED ORDER — SENNOSIDES-DOCUSATE SODIUM 8.6-50 MG PO TABS
2.0000 | ORAL_TABLET | ORAL | Status: DC
  Administered 2016-10-02 – 2016-10-03 (×2): 2 via ORAL
  Filled 2016-10-01 (×2): qty 2

## 2016-10-01 MED ORDER — SODIUM CHLORIDE 0.9% FLUSH: 3.0000 mL | INTRAVENOUS | Status: DC | PRN

## 2016-10-01 MED ORDER — IBUPROFEN 800 MG PO TABS
800.0000 mg | ORAL_TABLET | Freq: Three times a day (TID) | ORAL | Status: DC
  Administered 2016-10-01 – 2016-10-04 (×9): 800 mg via ORAL
  Filled 2016-10-01 (×8): qty 1

## 2016-10-01 MED ORDER — RHO D IMMUNE GLOBULIN 1500 UNIT/2ML IJ SOSY
300.0000 ug | PREFILLED_SYRINGE | Freq: Once | INTRAMUSCULAR | Status: AC
  Administered 2016-10-01: 300 ug via INTRAVENOUS
  Filled 2016-10-01: qty 2

## 2016-10-01 MED ORDER — OXYCODONE HCL 5 MG PO TABS
5.0000 mg | ORAL_TABLET | ORAL | Status: DC | PRN
  Administered 2016-10-01: 5 mg via ORAL
  Filled 2016-10-01: qty 1

## 2016-10-01 MED ORDER — ONDANSETRON HCL 4 MG/2ML IJ SOLN
4.0000 mg | Freq: Once | INTRAMUSCULAR | Status: AC
  Administered 2016-10-01: 4 mg via INTRAVENOUS
  Filled 2016-10-01: qty 2

## 2016-10-01 MED ORDER — OXYCODONE HCL 5 MG PO TABS: 10.0000 mg | ORAL_TABLET | ORAL | Status: DC | PRN

## 2016-10-01 MED ORDER — FAMOTIDINE 20 MG PO TABS
20.0000 mg | ORAL_TABLET | Freq: Two times a day (BID) | ORAL | Status: DC
  Administered 2016-10-02: 20 mg via ORAL
  Filled 2016-10-01 (×5): qty 1

## 2016-10-01 MED ORDER — SODIUM CHLORIDE 0.9% FLUSH
3.0000 mL | Freq: Two times a day (BID) | INTRAVENOUS | Status: DC
Start: 2016-10-01 — End: 2016-10-04

## 2016-10-01 MED ORDER — COCONUT OIL OIL: 1.0000 "application " | TOPICAL_OIL | Status: DC | PRN

## 2016-10-01 MED ORDER — TERBUTALINE SULFATE 1 MG/ML IJ SOLN
0.2500 mg | Freq: Once | INTRAMUSCULAR | Status: DC | PRN
  Filled 2016-10-01: qty 1

## 2016-10-01 MED ORDER — DIBUCAINE 1 % RE OINT: 1.0000 "application " | TOPICAL_OINTMENT | RECTAL | Status: DC | PRN

## 2016-10-01 MED ORDER — SODIUM CHLORIDE 0.9 % IV SOLN: 250.0000 mL | INTRAVENOUS | Status: DC | PRN

## 2016-10-01 MED ORDER — WITCH HAZEL-GLYCERIN EX PADS: 1.0000 "application " | MEDICATED_PAD | CUTANEOUS | Status: DC | PRN

## 2016-10-01 MED ORDER — OXYTOCIN 40 UNITS IN LACTATED RINGERS INFUSION - SIMPLE MED
1.0000 m[IU]/min | INTRAVENOUS | Status: DC
Start: 2016-10-01 — End: 2016-10-01
  Administered 2016-10-01: 2 m[IU]/min via INTRAVENOUS

## 2016-10-01 MED ORDER — BENZOCAINE-MENTHOL 20-0.5 % EX AERO
1.0000 "application " | INHALATION_SPRAY | CUTANEOUS | Status: DC | PRN
  Administered 2016-10-01: 1 via TOPICAL
  Filled 2016-10-01: qty 56

## 2016-10-01 MED ORDER — ACETAMINOPHEN 325 MG PO TABS
650.0000 mg | ORAL_TABLET | ORAL | Status: DC | PRN
  Filled 2016-10-01: qty 2

## 2016-10-01 NOTE — Progress Notes (Signed)
S:  Feeling legs again / no pain  O:  VS: Blood pressure 100/67, pulse 95, temperature 98.4 F (36.9 C), temperature source Axillary, resp. rate 20, height 5\' 6"  (1.676 m), weight 100.2 kg (221 lb), SpO2 100 %.        FHR : baseline 130 / variability moderate / accelerations + / no decelerations        Toco: contractions irregular /mild        Cervix : same        Membranes: clear with small show  A: active labor     FHR category 1     Inadequate ctx  P: pitocin augmentation   Susa LofflerBAILEY, Ohana Birdwell [name], [name], [facility] 10/01/2016, 12:35 AM

## 2016-10-01 NOTE — Anesthesia Postprocedure Evaluation (Addendum)
Anesthesia Post Note  Patient: Tanya Gross  Procedure(s) Performed: * No procedures listed *  Patient location during evaluation: Mother Baby Anesthesia Type: Epidural Level of consciousness: awake Pain management: pain level controlled Vital Signs Assessment: post-procedure vital signs reviewed and stable Respiratory status: spontaneous breathing Cardiovascular status: stable Postop Assessment: no headache, no backache, epidural receding, patient able to bend at knees, no signs of nausea or vomiting and adequate PO intake Anesthetic complications: no        Last Vitals:  Vitals:   10/01/16 0918 10/01/16 1100  BP: 122/84 110/66  Pulse: (!) 102 89  Resp: 20 18  Temp: 37.1 C 37.4 C    Last Pain:  Vitals:   10/01/16 1301  TempSrc:   PainSc: Asleep   Pain Goal:                 MULLINS,JANET

## 2016-10-02 DIAGNOSIS — D696 Thrombocytopenia, unspecified: Secondary | ICD-10-CM | POA: Diagnosis not present

## 2016-10-02 DIAGNOSIS — O99119 Other diseases of the blood and blood-forming organs and certain disorders involving the immune mechanism complicating pregnancy, unspecified trimester: Secondary | ICD-10-CM

## 2016-10-02 LAB — COMPREHENSIVE METABOLIC PANEL
ALT: 11 U/L — ABNORMAL LOW (ref 14–54)
AST: 21 U/L (ref 15–41)
Albumin: 2.3 g/dL — ABNORMAL LOW (ref 3.5–5.0)
Alkaline Phosphatase: 161 U/L — ABNORMAL HIGH (ref 38–126)
Anion gap: 5 (ref 5–15)
BUN: <5 mg/dL — ABNORMAL LOW (ref 6–20)
CO2: 23 mmol/L (ref 22–32)
Calcium: 7.9 mg/dL — ABNORMAL LOW (ref 8.9–10.3)
Chloride: 109 mmol/L (ref 101–111)
Creatinine, Ser: 0.56 mg/dL (ref 0.44–1.00)
GFR calc Af Amer: >60 mL/min (ref >60)
GFR calc non Af Amer: >60 mL/min (ref >60)
Glucose, Bld: 83 mg/dL (ref 65–99)
Potassium: 3.7 mmol/L (ref 3.5–5.1)
Sodium: 137 mmol/L (ref 135–145)
Total Bilirubin: 0.4 mg/dL (ref 0.3–1.2)
Total Protein: 5.1 g/dL — ABNORMAL LOW (ref 6.5–8.1)

## 2016-10-02 LAB — CBC
HCT: 35.8 % — ABNORMAL LOW (ref 36.0–46.0)
Hemoglobin: 13 g/dL (ref 12.0–15.0)
MCH: 32 pg (ref 26.0–34.0)
MCHC: 36.3 g/dL — ABNORMAL HIGH (ref 30.0–36.0)
MCV: 88.2 fL (ref 78.0–100.0)
Platelets: 120 10*3/uL — ABNORMAL LOW (ref 150–400)
RBC: 4.06 MIL/uL (ref 3.87–5.11)
RDW: 14 % (ref 11.5–15.5)
WBC: 8.7 10*3/uL (ref 4.0–10.5)

## 2016-10-02 LAB — RH IG WORKUP (INCLUDES ABO/RH)
ABO/RH(D): AB NEG
Fetal Screen: NEGATIVE
Gestational Age(Wks): 37.4
Unit division: 0

## 2016-10-02 LAB — URIC ACID: Uric Acid, Serum: 4 mg/dL (ref 2.3–6.6)

## 2016-10-02 MED ORDER — RHO D IMMUNE GLOBULIN 1500 UNIT/2ML IJ SOSY
300.0000 ug | PREFILLED_SYRINGE | Freq: Once | INTRAMUSCULAR | Status: DC
Start: 2016-10-02 — End: 2016-10-04
  Filled 2016-10-02: qty 2

## 2016-10-02 NOTE — Progress Notes (Signed)
PPD # 1 SVD Information for the patient's newborn:  Tanya Gross, Tanya Gross [[id number]][[id number]]  female    breast feeding , reports no difficulties w/ latch Baby name: Jonita Albeeden  S:  Reports feeling tired but much better since delivery, baby clusterfeeding but she is happy about it. Spouse at [facility] holding baby and very supportive.              Tolerating po/ No nausea or vomiting             Bleeding is moderate             Pain controlled with ibuprofen, mostly perineal and difficult to sit in bed, prefers the armcahir.              Up ad lib / ambulatory / voiding without difficulties        O:  A & O x 3, in no apparent distress              VS:  Vitals:   10/01/16 1100 10/01/16 1630 10/01/16 2250 10/02/16 0700  BP: 110/66 119/71 133/90 123/80  Pulse: 89 89 (!) 105 84  Resp: 18 18  18   Temp: 99.3 F (37.4 C) 98.6 F (37 C)  98.5 F (36.9 C)  TempSrc: Oral Oral    SpO2:  98%    Weight:      Height:        LABS:  Recent Labs  09/30/16 1215 10/02/16 0526  WBC 9.1 8.7  HGB 14.9 13.0  HCT 41.2 35.8*  PLT 157 120*    Blood type: --/--/AB NEG (01/27 1628)  Rubella: Immune (01/26 1244)   I&O: I/O last 3 completed shifts: In: -  Out: 950 [Urine:600; Blood:350]          No intake/output data recorded.  Lungs: Clear and unlabored  Heart: regular rate and rhythm / no murmurs  Abdomen: soft, non-tender, non-distended             Fundus: firm, non-tender, U-1  Perineum: 2nd deg perineal tear repaired, intact, minimal edema  Lochia: small  Extremities: +2 edema, no calf pain or tenderness    A/P: PPD # 1 25 y.o., G1P1001   Principal Problem:   Postpartum care following vaginal delivery (1/27) Active Problems:   SVD (spontaneous vaginal delivery)  Doing well - stable status  Routine post partum orders  Start Sitz baths today for comfort   Anxiety and depression - stable since delivery  Prozac 20 mg initiated  SW consult pending   Chronic hypertension affecting pregnancy  BP  mostly normal, no meds, no neural s/s, stable LFT's   Rh negative, maternal  Infant Rh pos, Rhophylac given   Gestational thrombocytopenia (HCC)  No evidence of hemorrhage, CBC pending in AM, will monitor for trend    Anticipate discharge tomorrow    Neta Mendsaniela C Gentry Pilson, [name], [name] 10/02/2016, 9:02 AM

## 2016-10-02 NOTE — Progress Notes (Signed)
Nurse at bedside.  Pt requested information on DEBP.  Nurse showed pt and sig other how to connect tubing to machine, proper flange size, care of machine, what parts are washable and what parts are not.  Pt referred to [employer] Web site for further questions or concerns once she has returned home or to contact outpatient LC here at hospital.

## 2016-10-02 NOTE — Progress Notes (Signed)
CSW acknowledges consult. CSW attempted twice to meet with MOB at bedside to complete assessment; however, both times MOB had several visitors present. CSW will attempt to complete assessment at a later time.   Liliya Fullenwider, MSW, LCSW-A Clinical Social Worker  [facility]  Office: [phone]  

## 2016-10-02 NOTE — Lactation Note (Signed)
This note was copied from a baby's chart. Lactation Consultation Note: Lactation Brochure given to mother with information on all Lactation services. Mother sitting up in chair with infant latched in cradle hold. Observed infant with flanged lips and suckling in a rhythmic pattern.  Mother reports that infant has been breastfeeding well . Mother denies having any concerns. Advised mother to continue to breastfeed infant with all feeding cues and at least 8-12 times in 24 hours. Discussed cluster feeding. Mother ask if we could come back tomorrow and show her how to use her electric pump. Told mother we will follow up in am before discharge.    Patient Name: Tanya Gross [id number]'XToday's Date: 10/02/2016 Reason for consult: Initial assessment   Maternal Data    Feeding Feeding Type: Breast Fed Length of feed: 30 min  LATCH Score/Interventions Latch: Grasps breast easily, tongue down, lips flanged, rhythmical sucking.  Audible Swallowing: A few with stimulation Intervention(s): Skin to skin  Type of Nipple: Everted at rest and after stimulation  Comfort (Breast/Nipple): Soft / non-tender     Hold (Positioning): Assistance needed to correctly position infant at breast and maintain latch. Intervention(s): Support Pillows;Position options  LATCH Score: 8  Lactation Tools Discussed/Used     Consult Status      Michel BickersKendrick, Carmyn Hamm McCoy 10/02/2016, 2:53 PM

## 2016-10-03 LAB — CBC
HCT: 38.8 % (ref 36.0–46.0)
HCT: 39.8 % (ref 36.0–46.0)
Hemoglobin: 14 g/dL (ref 12.0–15.0)
Hemoglobin: 13.7 g/dL (ref 12.0–15.0)
MCH: 31.7 pg (ref 26.0–34.0)
MCH: 30.7 pg (ref 26.0–34.0)
MCHC: 36.1 g/dL — ABNORMAL HIGH (ref 30.0–36.0)
MCHC: 34.4 g/dL (ref 30.0–36.0)
MCV: 88 fL (ref 78.0–100.0)
MCV: 89.2 fL (ref 78.0–100.0)
PLATELETS: 142 10*3/uL — AB (ref 150–400)
Platelets: 163 10*3/uL (ref 150–400)
RBC: 4.41 MIL/uL (ref 3.87–5.11)
RBC: 4.46 MIL/uL (ref 3.87–5.11)
RDW: 13.9 % (ref 11.5–15.5)
RDW: 13.9 % (ref 11.5–15.5)
WBC: 8.4 10*3/uL (ref 4.0–10.5)
WBC: 9 10*3/uL (ref 4.0–10.5)

## 2016-10-03 LAB — COMPREHENSIVE METABOLIC PANEL
ALT: 14 U/L (ref 14–54)
AST: 20 U/L (ref 15–41)
Albumin: 2.8 g/dL — ABNORMAL LOW (ref 3.5–5.0)
Alkaline Phosphatase: 158 U/L — ABNORMAL HIGH (ref 38–126)
Anion gap: 8 (ref 5–15)
BUN: 9 mg/dL (ref 6–20)
CO2: 21 mmol/L — ABNORMAL LOW (ref 22–32)
Calcium: 8.5 mg/dL — ABNORMAL LOW (ref 8.9–10.3)
Chloride: 109 mmol/L (ref 101–111)
Creatinine, Ser: 0.61 mg/dL (ref 0.44–1.00)
GFR calc Af Amer: >60 mL/min (ref >60)
GFR calc non Af Amer: >60 mL/min (ref >60)
Glucose, Bld: 98 mg/dL (ref 65–99)
Potassium: 3.7 mmol/L (ref 3.5–5.1)
Sodium: 138 mmol/L (ref 135–145)
Total Bilirubin: 0.5 mg/dL (ref 0.3–1.2)
Total Protein: 6.1 g/dL — ABNORMAL LOW (ref 6.5–8.1)

## 2016-10-03 LAB — URIC ACID: Uric Acid, Serum: 4.6 mg/dL (ref 2.3–6.6)

## 2016-10-03 MED ORDER — HYDROCHLOROTHIAZIDE 25 MG PO TABS
25.0000 mg | ORAL_TABLET | Freq: Every day | ORAL | Status: DC
  Administered 2016-10-03 – 2016-10-04 (×2): 25 mg via ORAL
  Filled 2016-10-03 (×2): qty 1

## 2016-10-03 NOTE — Lactation Note (Signed)
This note was copied from a baby's chart. Lactation Consultation Note  Patient Name: Tanya Gross ZOXWR'UToday's Date: 10/03/2016 Reason for consult: Follow-up assessment;Hyperbilirubinemia Phototherapy was recently initiated.  Mom states breastfeeding is going well although baby is sleepier.  Mom just gave baby formula due to decreased voids.  Discussed with mom the importance of good breast stimulation and adequate calories with jaundice.  [facility] DEBP set up and initiated.  Instructed to post pump and give back any expressed milk to baby.  Encouraged to call out for concerns/assist prn.  Maternal Data    Feeding Feeding Type: Breast Fed Length of feed: 30 min  LATCH Score/Interventions                      Lactation Tools Discussed/Used Pump Review: Setup, frequency, and cleaning;Milk Storage Initiated by:: LC Date initiated:: 10/03/16   Consult Status Consult Status: Follow-up Date: 10/04/16 Follow-up type: In-patient    Huston FoleyMOULDEN, Alford Gamero S 10/03/2016, 9:17 AM

## 2016-10-03 NOTE — Clinical Social Work Maternal (Signed)
  CLINICAL SOCIAL WORK MATERNAL/CHILD NOTE  Patient Details  Name: Tanya Gross MRN: [id number] Date of Birth: 10-12-1990  Date:  10/03/2016  Clinical Social Worker Initiating Note:  Laurey Arrow Date/ Time Initiated:  10/03/16/1400     Child's Name:  Tanya Gross   Legal Guardian:  Mother (FOB is Tonette Lederer )   Need for Interpreter:  None   Date of Referral:  10/03/16     Reason for Referral:   (hx of anxiety and depression)   Referral Source:  Central Nursery   Address:  [address] [place]  Phone number:  [phone]   Household Members:  Self, Significant Other, Minor Children   Natural Supports (not living in the home):  Parent, Immediate Family, Extended Family (MOB and FOB has a wealth of supporters from their immediate and extended family members. 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  Strengths:  Ability to meet basic needs , Home prepared for child , Understanding of illness   Risk Factors/Current Problems:  Mental Health Concerns    Cognitive State:  Able to Concentrate , Alert , Goal Oriented , Insightful , Linear Thinking    Mood/Affect:  Bright , Happy , Interested , Comfortable    CSW Assessment: CSW met with MOB to complete an assessment for hx of anxiety and depression. When CSW arrived, MOB was in bed pumping, FOB was relaxing in the recliner, and MOB's mother was caring for the baby. MOB gave CSW permission to meet with MOB while FOB and MOB's mother were present. CSW inquired about MOB's MH hx and MOB acknowledged a hx of anxiety and depression.  MOB communicated MOB was dx while attending college, and MOB has been regulated on medication prior  to pregnancy confirmation.  MOB discontinued medication after pregnancy confirmation in effort to have a healthier pregnancy. MOB communicated that MOB does not intend to return to a medication regiment but is open to the idea if signs and symptoms present.  MOB stated that MOB had little to no symptoms during pregnancy. CSW educated MOB about PPD. CSW informed MOB of possible supports and interventions to decrease PPD.  CSW also encouraged MOB to seek medical attention if needed for increased signs and symptoms for PPD. CSW offered MOB outpatient resources for behavioral health and MOB declined, however, MOB was open to attending support groups offered at the hospital (CSW provided MOB with flyer). CSW reviewed safe sleep, and SIDS. MOB was knowledgeable and asked appropriate questions.  MOB communicated that she has a bassinet for the baby, and feels prepared for the infant.  MOB did not have any further questions, concerns, or needs at this time.     CSW Plan/Description:  Information/Referral to [employer] , Patient/Family Education    Laurey Arrow, MSW, [employer] Social Work [phone]  Dimple Nanas, LCSW 10/03/2016, 2:04 PM

## 2016-10-03 NOTE — Progress Notes (Signed)
Interval note  VS: BP (!) 139/92 (BP Location: Right Arm)   Pulse 79   Temp 98.2 F (36.8 C) (Oral)   Resp 17   Ht 5\' 6"  (1.676 m)   Wt 100.2 kg (221 lb)   SpO2 99%   Breastfeeding? Unknown   BMI 35.67 kg/m    BP: 143/94 - 122/76 - 130/100 - 136/88   LABS: PIH labs stable - no evidence of PEC             Recent Labs  10/03/16 0614 10/03/16 1859  WBC 8.4 9.0  HGB 14.0 13.7  PLT 142* 163               Blood type: --/--/AB NEG (01/27 1628)  Rubella: Immune (01/26 1244)                     I&O:not done  A: PPD # 2             Chronic hypertension with labile BP - Labetalol 100 TID  Doing well - stable status  P: Routine post partum orders  Added HCTZ 25mg  today with instructions to increase water intake - no I&O documented             Increased anxiety with newborn elevated bilirubin             Re-evaluate in AM              Marlinda MikeBAILEY, Khristina Janota [name], [name], FACNM 10/03/2016, 11:19 PM

## 2016-10-03 NOTE — Progress Notes (Signed)
PPD 2 SVD with 2nd degree repair  S:  Reports feeling anxious this am - baby started on bili-lights             Tolerating po/ No nausea or vomiting             Bleeding is light             Pain controlled with motrin             Up ad lib / ambulatory / voiding QS  Newborn breast feeding  / female                     Supplementing with formula to treat bilirubin level  O:               VS: BP 136/88 (BP Location: Right Arm)   Pulse 72   Temp 98.2 F (36.8 C) (Oral)   Resp 18   Ht 5\' 6"  (1.676 m)   Wt 100.2 kg (221 lb)   SpO2 99%   Breastfeeding? Unknown   BMI 35.67 kg/m    BP 133/90 - 123/80 -127/78 - 136/88   LABS:              Recent Labs  10/02/16 0526 10/03/16 0614  WBC 8.7 8.4  HGB 13.0 14.0  PLT 120* 142*               Blood type: --/--/AB NEG (01/27 1628) / newborn RH + - rhogam ordered for administration prior to DC  Rubella: Immune (01/26 1244)                          Physical Exam:             Alert and oriented X3  Lungs: Clear and unlabored  Heart: regular rate and rhythm / no mumurs  Abdomen: soft, non-tender, non-distended              Fundus: firm, non-tender, U-1  Perineum: no edema  Lochia: light  Extremities: 2+ pedal edema, no calf pain or tenderness    A: PPD # 2             Chronic hypertension   Doing well - stable status  P: Routine post partum orders  HCTZ x 48 hours - increase water intake             Repeat PIH labs later today - monitor closely for PEC             Restart I&O (hemoconcentrated per CBC this am)  Marlinda MikeBAILEY, Taseen Marasigan [name], [name], FACNM 10/03/2016, 8:16 AM

## 2016-10-03 NOTE — Lactation Note (Signed)
This note was copied from a baby's chart. Lactation Consultation Note  Patient Name: Girl Velia MeyerSasha Winterhalter AOZHY'QToday's Date: 10/03/2016 Reason for consult: Follow-up assessment;Hyperbilirubinemia Mom called out for feeding assist.  Baby is at the breast in football hold but sleepy.  Mom trying to wake her and states her last feeding was 3 hours ago.  Waking techniques done and baby would wake briefly and then go back to sleep.  Baby would suck on bottle of expressed milk but unable to grasp breast.  24 mm nipple shield used and baby latched and suckled for a minute then sleepy.  Mom has pumped twice today and obtained 20 and 30 mls of transitional milk.  Reassured mom that jaundice can cause temporary sleepiness but this should improve as bilirubin decreases.  Instructed to continue pumping every 2-3 hours and bottle feed any expressed milk after latch attempt.  Encouraged to call with concerns/assist.  Maternal Data    Feeding Feeding Type: Breast Fed Nipple Type: Slow - flow  LATCH Score/Interventions Latch: Too sleepy or reluctant, no latch achieved, no sucking elicited. Intervention(s): Adjust position;Assist with latch;Breast massage;Breast compression  Audible Swallowing: None Intervention(s): Skin to skin;Hand expression  Type of Nipple: Everted at rest and after stimulation  Comfort (Breast/Nipple): Soft / non-tender     Hold (Positioning): Assistance needed to correctly position infant at breast and maintain latch. Intervention(s): Breastfeeding basics reviewed;Support Pillows;Position options;Skin to skin  LATCH Score: 5  Lactation Tools Discussed/Used Tools: Nipple Shields Nipple shield size: 24   Consult Status Consult Status: Follow-up Date: 10/04/16 Follow-up type: In-patient    Huston FoleyMOULDEN, Dunbar Buras S 10/03/2016, 3:15 PM

## 2016-10-04 ENCOUNTER — Encounter (HOSPITAL_COMMUNITY): Payer: Self-pay | Admitting: Obstetrics and Gynecology

## 2016-10-04 DIAGNOSIS — O1205 Gestational edema, complicating the puerperium: Secondary | ICD-10-CM

## 2016-10-04 HISTORY — DX: Gestational edema, complicating the puerperium: O12.05

## 2016-10-04 LAB — TYPE AND SCREEN
ABO/RH(D): AB NEG
Antibody Screen: POSITIVE
DAT, IgG: NEGATIVE
Unit division: 0
Unit division: 0

## 2016-10-04 MED ORDER — IBUPROFEN 800 MG PO TABS: 800.0000 mg | ORAL_TABLET | Freq: Three times a day (TID) | ORAL | 0 refills | Status: DC

## 2016-10-04 MED ORDER — FLUOXETINE HCL 20 MG PO CAPS: 20.0000 mg | ORAL_CAPSULE | Freq: Every day | ORAL | 3 refills | Status: DC

## 2016-10-04 NOTE — Discharge Summary (Signed)
OB Discharge Summary     Patient Name: Gross Gross DOB: 10/15/1990 MRN: [id number]  Date of admission: 09/30/2016 Delivering MD: Gross Gross   Date of discharge: 10/04/2016  Admitting diagnosis: INDUCTION Intrauterine pregnancy: 8182w4d     Secondary diagnosis:  Principal Problem:   Postpartum care following vaginal delivery (1/27) Active Problems:   SVD (spontaneous vaginal delivery)   Anxiety and depression   Chronic hypertension affecting pregnancy   Rh negative, maternal   Gestational thrombocytopenia (HCC)   Gestational edema affecting puerperium  Additional problems: none     Discharge diagnosis: Term Pregnancy Delivered, CHTN and Gestational Edema - postpartum                                                                                                Post partum procedures:rhogam  Augmentation: AROM, Pitocin, Cytotec and Foley Balloon  Complications: None  Hospital course:  Induction of Labor With Vaginal Delivery   26 y.o. yo G1P1001 at 7882w4d was admitted to the hospital 09/30/2016 for induction of labor.  Indication for induction: Favorable cervix at term and CHTN.  Patient had an uncomplicated labor course as follows: Membrane Rupture Time/Date: 11:28 PM ,09/30/2016   Intrapartum Procedures: Episiotomy:                                           Lacerations:  2nd degree [3]  Patient had delivery of a Viable infant.  Information for the patient's newborn:  Gross Gross [[id number]][[id number]]  Delivery Method: Vaginal, Spontaneous Delivery (Filed from Delivery Summary)   10/01/2016  Details of delivery can be found in separate delivery note.  Patient had a routine postpartum course. Patient is discharged home 10/04/16.  Physical exam  Vitals:   10/03/16 1720 10/03/16 1854 10/04/16 0500 10/04/16 0950  BP: (!) 143/94 (!) 139/92 (!) 140/99 126/74  Pulse: 84 79 78 95  Resp:  17 18   Temp: 98.2 F (36.8 C) 98.2 F (36.8 C) 98.2 F (36.8 C)   TempSrc: Oral  Oral Oral   SpO2:      Weight:      Height:       General: alert, cooperative and no distress Lochia: appropriate Uterine Fundus: firm DVT Evaluation: No evidence of DVT seen on physical exam. Negative Homan's sign. No cords or calf tenderness. Calf/Ankle edema is present Labs: Lab Results  Component Value Date   WBC 9.0 10/03/2016   HGB 13.7 10/03/2016   HCT 39.8 10/03/2016   MCV 89.2 10/03/2016   PLT 163 10/03/2016   CMP Latest Ref Rng & Units 10/03/2016  Glucose 65 - 99 mg/dL 98  BUN 6 - 20 mg/dL 9  Creatinine 4.690.44 - 6.291.00 mg/dL 5.280.61  Sodium 413135 - 244145 mmol/L 138  Potassium 3.5 - 5.1 mmol/L 3.7  Chloride 101 - 111 mmol/L 109  CO2 22 - 32 mmol/L 21(L)  Calcium 8.9 - 10.3 mg/dL 0.1(U8.5(L)  Total Protein 6.5 - 8.1 g/dL 6.1(L)  Total Bilirubin 0.3 -  1.2 mg/dL 0.5  Alkaline Phos 38 - 126 U/L 158(H)  AST 15 - 41 U/L 20  ALT 14 - 54 U/L 14    Discharge instruction: per After Visit Summary and "Baby and Me Booklet".  After visit meds:  Allergies as of 10/04/2016      Reactions   Cefdinir Anaphylaxis, Hives   omnicef      Medication List    TAKE these medications   FLUoxetine 20 MG capsule Commonly known as:  PROZAC Take 1 capsule (20 mg total) by mouth at bedtime.   ibuprofen 800 MG tablet Commonly known as:  ADVIL,MOTRIN Take 1 tablet (800 mg total) by mouth every 8 (eight) hours.   labetalol 100 MG tablet Commonly known as:  NORMODYNE Take 100 mg by mouth 3 (three) times daily.   prenatal multivitamin Tabs tablet Take 1 tablet by mouth daily at 12 noon.       Diet: routine diet  Activity: Advance as tolerated. Pelvic rest for 6 weeks.   Outpatient follow up:1 week for BP re-check and 6 weeks for postpartum visit Follow up Appt:No future appointments. Follow up Visit:No Follow-up on file.  Postpartum contraception: Undecided  Newborn Data: Live born female "Tanya Gross" on 10/01/2016 Birth Weight: 6 lb 12.1 oz (3065 g) APGAR: 6, 8  Baby Feeding: Bottle  and Breast Disposition:home with mother   10/04/2016 Gross Gross, [name]

## 2016-10-04 NOTE — Progress Notes (Signed)
Patient ID: Tanya Gross, female   DOB: 05-06-1991, 26 y.o.   MRN: [id number] PPD # 3 SVD with 2nd Degree Perineal Laceration  S:  Reports feeling well. Ready to go home.             Tolerating po/ No nausea or vomiting             Bleeding is light             Pain controlled with ibuprofen (OTC)             Up ad lib / ambulatory / voiding without difficulties     Information for the patient's newborn:  Hassell DoneWatterson, Girl Dene [[id number]][[id number]]  female "Eden"   Breast and bottle feeding   O:  A & O x 3, in no apparent distress              VS:  Vitals:   10/03/16 1130 10/03/16 1720 10/03/16 1854 10/04/16 0500  BP: 122/76 (!) 143/94 (!) 139/92 (!) 140/99  Pulse: 78 84 79 78  Resp:   17 18  Temp:  98.2 F (36.8 C) 98.2 F (36.8 C) 98.2 F (36.8 C)  TempSrc:  Oral Oral Oral  SpO2:      Weight:      Height:        LABS:  Recent Labs  10/03/16 0614 10/03/16 1859  WBC 8.4 9.0  HGB 14.0 13.7  HCT 38.8 39.8  PLT 142* 163    Blood type: --/--/AB NEG (01/27 1628) / Infant Rh POS / Rhophylac indicated  Rubella: Immune (01/26 1244)   I&O: No intake/output data recorded.          No intake/output data recorded.    Abdomen: soft, non-tender, non-distended             Fundus: firm, non-tender, U-3  Perineum: 2nd degree repair healing well, no edema  Lochia: minimal  Extremities: Trace edema, no calf pain or tenderness    A/P: PPD # 3 25 y.o., G1P1001   Principal Problem:   Postpartum care following vaginal delivery (1/27) Active Problems:   SVD (spontaneous vaginal delivery)   Anxiety and depression   Chronic hypertension affecting pregnancy   Rh negative, maternal   Gestational thrombocytopenia (HCC)    Doing well - stable status  Routine post partum orders  D/C home today  F/U with [facility] for BP re-check in 1 week    [name], [name], M, [name], [name] 10/04/2016, 8:52 AM

## 2016-10-04 NOTE — Discharge Instructions (Signed)
Postpartum Depression and Baby Blues °The postpartum period begins right after the birth of a baby. During this time, there is often a great amount of joy and excitement. It is also a time of many changes in the life of the parents. Regardless of how many times a mother gives birth, each child brings new challenges and dynamics to the family. It is not unusual to have feelings of excitement along with confusing shifts in moods, emotions, and thoughts. All mothers are at risk of developing postpartum depression or the "baby blues." These mood changes can occur right after giving birth, or they may occur many months after giving birth. The baby blues or postpartum depression can be mild or severe. Additionally, postpartum depression can go away rather quickly, or it can be a long-term condition. °What are the causes? °Raised hormone levels and the rapid drop in those levels are thought to be a main cause of postpartum depression and the baby blues. A number of hormones change during and after pregnancy. Estrogen and progesterone usually decrease right after the delivery of your baby. The levels of thyroid hormone and various cortisol steroids also rapidly drop. Other factors that play a role in these mood changes include major life events and genetics. °What increases the risk? °If you have any of the following risks for the baby blues or postpartum depression, know what symptoms to watch out for during the postpartum period. Risk factors that may increase the likelihood of getting the baby blues or postpartum depression include: °· Having a personal or family history of depression. °· Having depression while being pregnant. °· Having premenstrual mood issues or mood issues related to oral contraceptives. °· Having a lot of life stress. °· Having marital conflict. °· Lacking a social support network. °· Having a baby with special needs. °· Having health problems, such as diabetes. °What are the signs or  symptoms? °Symptoms of baby blues include: °· Brief changes in mood, such as going from extreme happiness to sadness. °· Decreased concentration. °· Difficulty sleeping. °· Crying spells, tearfulness. °· Irritability. °· Anxiety. °Symptoms of postpartum depression typically begin within the first month after giving birth. These symptoms include: °· Difficulty sleeping or excessive sleepiness. °· Marked weight loss. °· Agitation. °· Feelings of worthlessness. °· Lack of interest in activity or food. °Postpartum psychosis is a very serious condition and can be dangerous. Fortunately, it is rare. Displaying any of the following symptoms is cause for immediate medical attention. Symptoms of postpartum psychosis include: °· Hallucinations and delusions. °· Bizarre or disorganized behavior. °· Confusion or disorientation. °How is this diagnosed? °A diagnosis is made by an evaluation of your symptoms. There are no medical or lab tests that lead to a diagnosis, but there are various questionnaires that a health care provider may use to identify those with the baby blues, postpartum depression, or psychosis. Often, a screening tool called the Edinburgh Postnatal Depression Scale is used to diagnose depression in the postpartum period. °How is this treated? °The baby blues usually goes away on its own in 1-2 weeks. Social support is often all that is needed. You will be encouraged to get adequate sleep and rest. Occasionally, you may be given medicines to help you sleep. °Postpartum depression requires treatment because it can last several months or longer if it is not treated. Treatment may include individual or group therapy, medicine, or both to address any social, physiological, and psychological factors that may play a role in the depression. Regular exercise, a   healthy diet, rest, and social support may also be strongly recommended. °Postpartum psychosis is more serious and needs treatment right away. Hospitalization is  often needed. °Follow these instructions at home: °· Get as much rest as you can. Nap when the baby sleeps. °· Exercise regularly. Some women find yoga and walking to be beneficial. °· Eat a balanced and nourishing diet. °· Do little things that you enjoy. Have a cup of tea, take a bubble bath, read your favorite magazine, or listen to your favorite music. °· Avoid alcohol. °· Ask for help with household chores, cooking, grocery shopping, or running errands as needed. Do not try to do everything. °· Talk to people close to you about how you are feeling. Get support from your partner, family members, friends, or other new moms. °· Try to stay positive in how you think. Think about the things you are grateful for. °· Do not spend a lot of time alone. °· Only take over-the-counter or prescription medicine as directed by your health care provider. °· Keep all your postpartum appointments. °· Let your health care provider know if you have any concerns. °Contact a health care provider if: °You are having a reaction to or problems with your medicine. °Get help right away if: °· You have suicidal feelings. °· You think you may harm the baby or someone else. °This information is not intended to replace advice given to you by your health care provider. Make sure you discuss any questions you have with your health care provider. °Document Released: 05/26/2004 Document Revised: 01/28/2016 Document Reviewed: 06/03/2013 °Elsevier Interactive Patient Education © 2017 [employer] °Postpartum Care After Vaginal Delivery °The period of time right after you deliver your newborn is called the postpartum period. °What kind of medical care will I receive? °· You may continue to receive fluids and medicines through an IV tube inserted into one of your veins. °· If an incision was made near your vagina (episiotomy) or if you had some vaginal tearing during delivery, cold compresses may be placed on your episiotomy or your tear. This  helps to reduce pain and swelling. °· You may be given a squirt bottle to use when you go to the bathroom. You may use this until you are comfortable wiping as usual. To use the squirt bottle, follow these steps: °¨ Before you urinate, fill the squirt bottle with warm water. Do not use hot water. °¨ After you urinate, while you are sitting on the toilet, use the squirt bottle to rinse the area around your urethra and vaginal opening. This rinses away any urine and blood. °¨ You may do this instead of wiping. As you start healing, you may use the squirt bottle before wiping yourself. Make sure to wipe gently. °¨ Fill the squirt bottle with clean water every time you use the bathroom. °· You will be given sanitary pads to wear. °How can I expect to feel? °· You may not feel the need to urinate for several hours after delivery. °· You will have some soreness and pain in your abdomen and vagina. °· If you are breastfeeding, you may have uterine contractions every time you breastfeed for up to several weeks postpartum. Uterine contractions help your uterus return to its normal size. °· It is normal to have vaginal bleeding (lochia) after delivery. The amount and appearance of lochia is often similar to a menstrual period in the first week after delivery. It will gradually decrease over the next few weeks to a   dry, yellow-brown discharge. For most women, lochia stops completely by 6-8 weeks after delivery. Vaginal bleeding can vary from woman to woman.  Within the first few days after delivery, you may have breast engorgement. This is when your breasts feel heavy, full, and uncomfortable. Your breasts may also throb and feel hard, tightly stretched, warm, and tender. After this occurs, you may have milk leaking from your breasts.Your health care provider can help you relieve discomfort due to breast engorgement. Breast engorgement should go away within a few days.  You may feel more sad or worried than normal due to  hormonal changes after delivery. These feelings should not last more than a few days. If these feelings do not go away after several days, speak with your health care provider. How should I care for myself?  Tell your health care provider if you have pain or discomfort.  Drink enough water to keep your urine clear or pale yellow.  Wash your hands thoroughly with soap and water for at least 20 seconds after changing your sanitary pads, after using the toilet, and before holding or feeding your baby.  If you are not breastfeeding, avoid touching your breasts a lot. Doing this can make your breasts produce more milk.  If you become weak or lightheaded, or you feel like you might faint, ask for help before:  Getting out of bed.  Showering.  Change your sanitary pads frequently. Watch for any changes in your flow, such as a sudden increase in volume, a change in color, the passing of large blood clots. If you pass a blood clot from your vagina, save it to show to your health care provider. Do not flush blood clots down the toilet without having your health care provider look at them.  Make sure that all your vaccinations are up to date. This can help protect you and your baby from getting certain diseases. You may need to have immunizations done before you leave the hospital.  If desired, talk with your health care provider about methods of family planning or birth control (contraception). How can I start bonding with my baby? Spending as much time as possible with your baby is very important. During this time, you and your baby can get to know each other and develop a bond. Having your baby stay with you in your room (rooming in) can give you time to get to know your baby. Rooming in can also help you become comfortable caring for your baby. Breastfeeding can also help you bond with your baby. How can I plan for returning home with my baby?  Make sure that you have a car seat installed in your  vehicle.  Your car seat should be checked by a certified car seat installer to make sure that it is installed safely.  Make sure that your baby fits into the car seat safely.  Ask your health care provider any questions you have about caring for yourself or your baby. Make sure that you are able to contact your health care provider with any questions after leaving the hospital. This information is not intended to replace advice given to you by your health care provider. Make sure you discuss any questions you have with your health care provider. Document Released: 06/19/2007 Document Revised: 01/25/2016 Document Reviewed: 07/27/2015 Elsevier Interactive Patient Education  2017 [employer] Inc[employer] Mastitis Mastitis is inflammation of the breast tissue. It occurs most often in women who are breastfeeding, but it can also affect other women, and  even sometimes men. CAUSES  Mastitis is usually caused by a bacterial infection. Bacteria enter the breast tissue through cuts or openings in the skin. Typically, this occurs with breastfeeding because of cracked or irritated skin. Sometimes, it can occur even when there is no opening in the skin. It can be associated with plugged milk (lactiferous) ducts. Nipple piercing can also lead to mastitis. Also, some forms of breast cancer can cause mastitis. SIGNS AND SYMPTOMS   Swelling, redness, tenderness, and pain in an area of the breast.  Swelling of the glands under the arm on the same side.  Fever. If an infection is allowed to progress, a collection of pus (abscess) may develop. DIAGNOSIS  Your health care provider can usually diagnose mastitis based on your symptoms and a physical exam. Tests may be done to help confirm the diagnosis. These may include:   Removal of pus from the breast by applying pressure to the area. This pus can be examined in the lab to determine which bacteria are present. If an abscess has developed, the fluid in the abscess can be  removed with a needle. This can also be used to confirm the diagnosis and determine the bacteria present. In most cases, pus will not be present.  Blood tests to determine if your body is fighting a bacterial infection.  Mammogram or ultrasound tests to rule out other problems or diseases. TREATMENT  Antibiotic medicine is used to treat a bacterial infection. Your health care provider will determine which bacteria are most likely causing the infection and will select an appropriate antibiotic. This is sometimes changed based on the results of tests performed to identify the bacteria, or if there is no response to the antibiotic selected. Antibiotics are usually given by mouth. You may also be given medicine for pain. Mastitis that occurs with breastfeeding will sometimes go away on its own, so your health care provider may choose to wait 24 hours after first seeing you to decide whether a prescription medicine is needed. HOME CARE INSTRUCTIONS   Only take over-the-counter or prescription medicines for pain, fever, or discomfort as directed by your health care provider.  If your health care provider prescribed an antibiotic, take the medicine as directed. Make sure you finish it even if you start to feel better.  Do not wear a tight or underwire bra. Wear a soft, supportive bra.  Increase your fluid intake, especially if you have a fever.  Women who are breastfeeding should follow these instructions:  Continue to empty the breast. Your health care provider can tell you whether this milk is safe for your infant or needs to be thrown out. You may be told to stop nursing until your health care provider thinks it is safe for your baby. Use a breast pump if you are advised to stop nursing.  Keep your nipples clean and dry.  Empty the first breast completely before going to the other breast. If your baby is not emptying your breasts completely for some reason, use a breast pump to empty your  breasts.  If you go back to work, pump your breasts while at work to stay in time with your nursing schedule.  Avoid allowing your breasts to become overly filled with milk (engorged). SEEK MEDICAL CARE IF:   You have pus-like discharge from the breast.  Your symptoms do not improve with the treatment prescribed by your health care provider within 2 days. SEEK IMMEDIATE MEDICAL CARE IF:   Your pain and swelling  are getting worse.  You have pain that is not controlled with medicine.  You have a red line extending from the breast toward your armpit.  You have a fever or persistent symptoms for more than 2-3 days.  You have a fever and your symptoms suddenly get worse. This information is not intended to replace advice given to you by your health care provider. Make sure you discuss any questions you have with your health care provider. Document Released: 08/22/2005 Document Revised: 08/27/2013 Document Reviewed: 03/22/2013 Elsevier Interactive Patient Education  2017 [employer]. Breastfeeding Deciding to breastfeed is one of the best choices you can make for you and your baby. A change in hormones during pregnancy causes your breast tissue to grow and increases the number and size of your milk ducts. These hormones also allow proteins, sugars, and fats from your blood supply to make breast milk in your milk-producing glands. Hormones prevent breast milk from being released before your baby is born as well as prompt milk flow after birth. Once breastfeeding has begun, thoughts of your baby, as well as his or her sucking or crying, can stimulate the release of milk from your milk-producing glands. Benefits of breastfeeding For Your Baby  Your first milk (colostrum) helps your baby's digestive system function better.  There are antibodies in your milk that help your baby fight off infections.  Your baby has a lower incidence of asthma, allergies, and sudden infant death  syndrome.  The nutrients in breast milk are better for your baby than infant formulas and are designed uniquely for your babys needs.  Breast milk improves your baby's brain development.  Your baby is less likely to develop other conditions, such as childhood obesity, asthma, or type 2 diabetes mellitus. For You  Breastfeeding helps to create a very special bond between you and your baby.  Breastfeeding is convenient. Breast milk is always available at the correct temperature and costs nothing.  Breastfeeding helps to burn calories and helps you lose the weight gained during pregnancy.  Breastfeeding makes your uterus contract to its prepregnancy size faster and slows bleeding (lochia) after you give birth.  Breastfeeding helps to lower your risk of developing type 2 diabetes mellitus, osteoporosis, and breast or ovarian cancer later in life. Signs that your baby is hungry Early Signs of Hunger  Increased alertness or activity.  Stretching.  Movement of the head from side to side.  Movement of the head and opening of the mouth when the corner of the mouth or cheek is stroked (rooting).  Increased sucking sounds, smacking lips, cooing, sighing, or squeaking.  Hand-to-mouth movements.  Increased sucking of fingers or hands. Late Signs of Hunger  Fussing.  Intermittent crying. Extreme Signs of Hunger  Signs of extreme hunger will require calming and consoling before your baby will be able to breastfeed successfully. Do not wait for the following signs of extreme hunger to occur before you initiate breastfeeding:  Restlessness.  A loud, strong cry.  Screaming. Breastfeeding basics  Breastfeeding Initiation  Find a comfortable place to sit or lie down, with your neck and back well supported.  Place a pillow or rolled up blanket under your baby to bring him or her to the level of your breast (if you are seated). Nursing pillows are specially designed to help support  your arms and your baby while you breastfeed.  Make sure that your baby's abdomen is facing your abdomen.  Gently massage your breast. With your fingertips, massage from your  chest wall toward your nipple in a circular motion. This encourages milk flow. You may need to continue this action during the feeding if your milk flows slowly.  Support your breast with 4 fingers underneath and your thumb above your nipple. Make sure your fingers are well away from your nipple and your babys mouth.  Stroke your baby's lips gently with your finger or nipple.  When your baby's mouth is open wide enough, quickly bring your baby to your breast, placing your entire nipple and as much of the colored area around your nipple (areola) as possible into your baby's mouth.  More areola should be visible above your baby's upper lip than below the lower lip.  Your baby's tongue should be between his or her lower gum and your breast.  Ensure that your baby's mouth is correctly positioned around your nipple (latched). Your baby's lips should create a seal on your breast and be turned out (everted).  It is common for your baby to suck about 2-3 minutes in order to start the flow of breast milk. Latching  Teaching your baby how to latch on to your breast properly is very important. An improper latch can cause nipple pain and decreased milk supply for you and poor weight gain in your baby. Also, if your baby is not latched onto your nipple properly, he or she may swallow some air during feeding. This can make your baby fussy. Burping your baby when you switch breasts during the feeding can help to get rid of the air. However, teaching your baby to latch on properly is still the best way to prevent fussiness from swallowing air while breastfeeding. Signs that your baby has successfully latched on to your nipple:  Silent tugging or silent sucking, without causing you pain.  Swallowing heard between every 3-4  sucks.  Muscle movement above and in front of his or her ears while sucking. Signs that your baby has not successfully latched on to nipple:  Sucking sounds or smacking sounds from your baby while breastfeeding.  Nipple pain. If you think your baby has not latched on correctly, slip your finger into the corner of your babys mouth to break the suction and place it between your baby's gums. Attempt breastfeeding initiation again. Signs of Successful Breastfeeding  Signs from your baby:  A gradual decrease in the number of sucks or complete cessation of sucking.  Falling asleep.  Relaxation of his or her body.  Retention of a small amount of milk in his or her mouth.  Letting go of your breast by himself or herself. Signs from you:  Breasts that have increased in firmness, weight, and size 1-3 hours after feeding.  Breasts that are softer immediately after breastfeeding.  Increased milk volume, as well as a change in milk consistency and color by the fifth day of breastfeeding.  Nipples that are not sore, cracked, or bleeding. Signs That Your Pecola LeisureBaby is Getting Enough Milk  Wetting at least 1-2 diapers during the first 24 hours after birth.  Wetting at least 5-6 diapers every 24 hours for the first week after birth. The urine should be clear or pale yellow by 5 days after birth.  Wetting 6-8 diapers every 24 hours as your baby continues to grow and develop.  At least 3 stools in a 24-hour period by age 39 days. The stool should be soft and yellow.  At least 3 stools in a 24-hour period by age 16 days. The stool should be seedy  and yellow.  No loss of weight greater than 10% of birth weight during the first 46 days of age.  Average weight gain of 4-7 ounces (113-198 g) per week after age 64 days.  Consistent daily weight gain by age 68 days, without weight loss after the age of 2 weeks. After a feeding, your baby may spit up a small amount. This is common. Breastfeeding frequency  and duration Frequent feeding will help you make more milk and can prevent sore nipples and breast engorgement. Breastfeed when you feel the need to reduce the fullness of your breasts or when your baby shows signs of hunger. This is called "breastfeeding on demand." Avoid introducing a pacifier to your baby while you are working to establish breastfeeding (the first 4-6 weeks after your baby is born). After this time you may choose to use a pacifier. Research has shown that pacifier use during the first year of a baby's life decreases the risk of sudden infant death syndrome (SIDS). Allow your baby to feed on each breast as long as he or she wants. Breastfeed until your baby is finished feeding. When your baby unlatches or falls asleep while feeding from the first breast, offer the second breast. Because newborns are often sleepy in the first few weeks of life, you may need to awaken your baby to get him or her to feed. Breastfeeding times will vary from baby to baby. However, the following rules can serve as a guide to help you ensure that your baby is properly fed:  Newborns (babies 77 weeks of age or younger) may breastfeed every 1-3 hours.  Newborns should not go longer than 3 hours during the day or 5 hours during the night without breastfeeding.  You should breastfeed your baby a minimum of 8 times in a 24-hour period until you begin to introduce solid foods to your baby at around 30 months of age. Breast milk pumping Pumping and storing breast milk allows you to ensure that your baby is exclusively fed your breast milk, even at times when you are unable to breastfeed. This is especially important if you are going back to work while you are still breastfeeding or when you are not able to be present during feedings. Your lactation consultant can give you guidelines on how long it is safe to store breast milk. A breast pump is a machine that allows you to pump milk from your breast into a sterile  bottle. The pumped breast milk can then be stored in a refrigerator or freezer. Some breast pumps are operated by hand, while others use electricity. Ask your lactation consultant which type will work best for you. Breast pumps can be purchased, but some hospitals and breastfeeding support groups lease breast pumps on a monthly basis. A lactation consultant can teach you how to hand express breast milk, if you prefer not to use a pump. Caring for your breasts while you breastfeed Nipples can become dry, cracked, and sore while breastfeeding. The following recommendations can help keep your breasts moisturized and healthy:  Avoid using soap on your nipples.  Wear a supportive bra. Although not required, special nursing bras and tank tops are designed to allow access to your breasts for breastfeeding without taking off your entire bra or top. Avoid wearing underwire-style bras or extremely tight bras.  Air dry your nipples for 3-82minutes after each feeding.  Use only cotton bra pads to absorb leaked breast milk. Leaking of breast milk between feedings is normal.  Use lanolin on your nipples after breastfeeding. Lanolin helps to maintain your skin's normal moisture barrier. If you use pure lanolin, you do not need to wash it off before feeding your baby again. Pure lanolin is not toxic to your baby. You may also hand express a few drops of breast milk and gently massage that milk into your nipples and allow the milk to air dry. In the first few weeks after giving birth, some women experience extremely full breasts (engorgement). Engorgement can make your breasts feel heavy, warm, and tender to the touch. Engorgement peaks within 3-5 days after you give birth. The following recommendations can help ease engorgement:  Completely empty your breasts while breastfeeding or pumping. You may want to start by applying warm, moist heat (in the shower or with warm water-soaked hand towels) just before feeding or  pumping. This increases circulation and helps the milk flow. If your baby does not completely empty your breasts while breastfeeding, pump any extra milk after he or she is finished.  Wear a snug bra (nursing or regular) or tank top for 1-2 days to signal your body to slightly decrease milk production.  Apply ice packs to your breasts, unless this is too uncomfortable for you.  Make sure that your baby is latched on and positioned properly while breastfeeding. If engorgement persists after 48 hours of following these recommendations, contact your health care provider or a Advertising copywriterlactation consultant. Overall health care recommendations while breastfeeding  Eat healthy foods. Alternate between meals and snacks, eating 3 of each per day. Because what you eat affects your breast milk, some of the foods may make your baby more irritable than usual. Avoid eating these foods if you are sure that they are negatively affecting your baby.  Drink milk, fruit juice, and water to satisfy your thirst (about 10 glasses a day).  Rest often, relax, and continue to take your prenatal vitamins to prevent fatigue, stress, and anemia.  Continue breast self-awareness checks.  Avoid chewing and smoking tobacco. Chemicals from cigarettes that pass into breast milk and exposure to secondhand smoke may harm your baby.  Avoid alcohol and drug use, including marijuana. Some medicines that may be harmful to your baby can pass through breast milk. It is important to ask your health care provider before taking any medicine, including all over-the-counter and prescription medicine as well as vitamin and herbal supplements. It is possible to become pregnant while breastfeeding. If birth control is desired, ask your health care provider about options that will be safe for your baby. Contact a health care provider if:  You feel like you want to stop breastfeeding or have become frustrated with breastfeeding.  You have painful  breasts or nipples.  Your nipples are cracked or bleeding.  Your breasts are red, tender, or warm.  You have a swollen area on either breast.  You have a fever or chills.  You have nausea or vomiting.  You have drainage other than breast milk from your nipples.  Your breasts do not become full before feedings by the fifth day after you give birth.  You feel sad and depressed.  Your baby is too sleepy to eat well.  Your baby is having trouble sleeping.  Your baby is wetting less than 3 diapers in a 24-hour period.  Your baby has less than 3 stools in a 24-hour period.  Your baby's skin or the white part of his or her eyes becomes yellow.  Your baby is not gaining weight  by 6 days of age. Get help right away if:  Your baby is overly tired (lethargic) and does not want to wake up and feed.  Your baby develops an unexplained fever. This information is not intended to replace advice given to you by your health care provider. Make sure you discuss any questions you have with your health care provider. Document Released: 08/22/2005 Document Revised: 02/03/2016 Document Reviewed: 02/13/2013 Elsevier Interactive Patient Education  2017 Elsevier Inc. Breast Pumping Tips If you are breastfeeding, there may be times when you cannot feed your baby directly. Returning to work or going on a trip are common examples. Pumping allows you to store breast milk and feed it to your baby later. You may not get much milk when you first start to pump. Your breasts should start to make more after a few days. If you pump at the times you usually feed your baby, you may be able to keep making enough milk to feed your baby without also using formula. The more often you pump, the more milk you will produce. When should I pump?  You can begin to pump soon after delivery. However, some experts recommend waiting about 4 weeks before giving your infant a bottle to make sure breastfeeding is going  well.  If you plan to return to work, begin pumping a few weeks before. This will help you develop techniques that work best for you. It also lets you build up a supply of breast milk.  When you are with your infant, feed on demand and pump after each feeding.  When you are away from your infant for several hours, pump for about 15 minutes every 2-3 hours. Pump both breasts at the same time if you can.  If your infant has a formula feeding, make sure to pump around the same time.  If you drink any alcohol, wait 2 hours before pumping. How do I prepare to pump? Your let-down reflexis the natural reaction to stimulation that makes your breast milk flow. It is easier to stimulate this reflex when you are relaxed. Find relaxation techniques that work for you. If you have difficulty with your let-down reflex, try these methods:  Smell one of your infant's blankets or an item of clothing.  Look at a picture or video of your infant.  Sit in a quiet, private space.  Massage the breast you plan to pump.  Place soothing warmth on the breast.  Play relaxing music. What are some general breast pumping tips?  Wash your hands before you pump. You do not need to wash your nipples or breasts.  There are three ways to pump.  You can use your hand to massage and compress your breast.  You can use a handheld manual pump.  You can use an electric pump.  Make sure the suction cup (flange) on the breast pump is the right size. Place the flange directly over the nipple. If it is the wrong size or placed the wrong way, it may be painful and cause nipple damage.  If pumping is uncomfortable, apply a small amount of purified or modified lanolin to your nipple and areola.  If you are using an electric pump, adjust the speed and suction power to be more comfortable.  If pumping is painful or if you are not getting very much milk, you may need a different type of pump. A lactation consultant can help  you determine what type of pump to use.  Keep a full water  bottle near you at all times. Drinking lots of fluid helps you make more milk.  You can store your milk to use later. Pumped breast milk can be stored in a sealable, sterile container or plastic bag. Label all stored breast milk with the date you pumped it.  Milk can stay out at room temperature for up to 8 hours.  You can store your milk in the refrigerator for up to 8 days.  You can store your milk in the freezer for 3 months. Thaw frozen milk using warm water. Do not put it in the microwave.  Do not smoke. Smoking can lower your milk supply and harm your infant. If you need help quitting, ask your health care provider to recommend a program. When should I call my health care provider or a lactation consultant?  You are having trouble pumping.  You are concerned that you are not making enough milk.  You have nipple pain, soreness, or redness.  You want to use birth control. Birth control pills may lower your milk supply. Talk to your health care provider about your options. This information is not intended to replace advice given to you by your health care provider. Make sure you discuss any questions you have with your health care provider. Document Released: 02/09/2010 Document Revised: 02/03/2016 Document Reviewed: 06/14/2013 Elsevier Interactive Patient Education  2017 [employer]

## 2016-10-04 NOTE — Lactation Note (Signed)
This note was copied from a baby's chart. Lactation Consultation Note  Patient Name: Tanya Gross WUJWJ'XToday's Date: 10/04/2016 Reason for consult: Follow-up assessment;Hyperbilirubinemia;Difficult latch  Baby 76 hours old. Parents report that they have been putting baby to breast first and then giving EBM. Enc parents to continue putting baby to breast first with cues and at least every 3 hours, then supplement, and then post-pump followed by hand expression. Offered to assist with a latch, but mom reports that the baby was latching fine until she became sleepy with jaundice. Mom reports that she has a DEBP at home. Mom states that baby prefers left nipple to right--she is nursing well on left breast in football position, so enc using cross-cradle position on right breast. Mom's breasts are filling, so discussed engorgement prevention/treatment. Mom aware of OP/BFSG and LC phone line assistance after D/C.   Mom c/o knots in her breasts and underarms. Enc leaving the underarm areas alone--icing as needed, but no stimulation/massage. Discussed converting a sports bra for hands free pumping and then massaging knots in breasts while pumping. Mom reports that she is comfortable with massage and hand expression.  Maternal Data Has patient been taught Hand Expression?: Yes Does the patient have breastfeeding experience prior to this delivery?: No  Feeding Feeding Type: Breast Fed  LATCH Score/Interventions                      Lactation Tools Discussed/Used     Consult Status Consult Status: PRN    Sherlyn HayJennifer D Sincere Berlanga 10/04/2016, 10:32 AM

## 2017-02-03 NOTE — Addendum Note (Signed)
Addendum  created 02/03/17 0939 by Tykesha Konicki D, MD   Sign clinical note    

## 2018-05-25 DIAGNOSIS — J029 Acute pharyngitis, unspecified: Secondary | ICD-10-CM | POA: Diagnosis not present

## 2018-05-25 DIAGNOSIS — R05 Cough: Secondary | ICD-10-CM | POA: Diagnosis not present

## 2018-06-07 DIAGNOSIS — J3501 Chronic tonsillitis: Secondary | ICD-10-CM | POA: Diagnosis not present

## 2018-06-12 DIAGNOSIS — Z23 Encounter for immunization: Secondary | ICD-10-CM | POA: Diagnosis not present

## 2018-06-19 DIAGNOSIS — G43719 Chronic migraine without aura, intractable, without status migrainosus: Secondary | ICD-10-CM | POA: Diagnosis not present

## 2018-06-19 DIAGNOSIS — G43901 Migraine, unspecified, not intractable, with status migrainosus: Secondary | ICD-10-CM | POA: Diagnosis not present

## 2018-06-21 ENCOUNTER — Other Ambulatory Visit: Payer: Self-pay | Admitting: Specialist

## 2018-06-21 DIAGNOSIS — R51 Headache: Principal | ICD-10-CM

## 2018-06-21 DIAGNOSIS — R519 Headache, unspecified: Secondary | ICD-10-CM

## 2018-07-17 ENCOUNTER — Other Ambulatory Visit: Payer: Self-pay | Admitting: Otolaryngology

## 2018-07-25 NOTE — Pre-Procedure Instructions (Signed)
Barrett ShellSasha A Piacente  07/25/2018      [facility] - [place], [place] - [address] [place] [id number] Phone: [phone] Fax: [phone]    Your procedure is scheduled on Monday November 25th.  [facility].M.  Call this number if you have problems the morning of surgery:  [phone]   Remember:  Do not eat or drink after midnight.    Take these medicines the morning of surgery with A SIP OF WATER   None  7 days prior to surgery STOP taking any Aspirin(unless otherwise instructed by your surgeon), Aleve, Naproxen, Ibuprofen, Motrin, Advil, Goody's, BC's, all herbal medications, fish oil, and all vitamins     Do not wear jewelry, make-up or nail polish.  Do not wear lotions, powders, or perfumes, or deodorant.  Do not shave 48 hours prior to surgery.  Men may shave face and neck.  Do not bring valuables to the hospital.  [facility] is not responsible for any belongings or valuables.  Contacts, dentures or bridgework may not be worn into surgery.  Leave your suitcase in the car.  After surgery it may be brought to your room.  For patients admitted to the hospital, discharge time will be determined by your treatment team.  Patients discharged the day of surgery will not be allowed to drive home.    [place]- Preparing For Surgery  Before surgery, you can play an important role. Because skin is not sterile, your skin needs to be as free of germs as possible. You can reduce the number of germs on your skin by washing with CHG (chlorahexidine gluconate) Soap before surgery.  CHG is an antiseptic cleaner which kills germs and bonds with the skin to continue killing germs even after washing.    Oral Hygiene is also important to reduce your risk of infection.  Remember - BRUSH YOUR TEETH THE MORNING OF SURGERY WITH YOUR REGULAR TOOTHPASTE  Please do not use if you have an allergy to CHG  or antibacterial soaps. If your skin becomes reddened/irritated stop using the CHG.  Do not shave (including legs and underarms) for at least 48 hours prior to first CHG shower. It is OK to shave your face.  Please follow these instructions carefully.   1. Shower the NIGHT BEFORE SURGERY and the MORNING OF SURGERY with CHG.   2. If you chose to wash your hair, wash your hair first as usual with your normal shampoo.  3. After you shampoo, rinse your hair and body thoroughly to remove the shampoo.  4. Use CHG as you would any other liquid soap. You can apply CHG directly to the skin and wash gently with a scrungie or a clean washcloth.   5. Apply the CHG Soap to your body ONLY FROM THE NECK DOWN.  Do not use on open wounds or open sores. Avoid contact with your eyes, ears, mouth and genitals (private parts). Wash Face and genitals (private parts)  with your normal soap.  6. Wash thoroughly, paying special attention to the area where your surgery will be performed.  7. Thoroughly rinse your body with warm water from the neck down.  8. DO NOT shower/wash with your normal soap after using and rinsing off the CHG Soap.  9. Pat yourself dry with a CLEAN TOWEL.  10. Wear CLEAN PAJAMAS to bed the night before surgery, wear comfortable clothes the morning of surgery  11. Place CLEAN SHEETS on your bed the night of your first shower and DO NOT SLEEP WITH PETS.    Day of Surgery:  Do not apply any deodorants/lotions.  Please wear clean clothes to the hospital/surgery center.   Remember to brush your teeth WITH YOUR REGULAR TOOTHPASTE.    Please read over the following fact sheets that you were given.

## 2018-07-26 ENCOUNTER — Encounter (HOSPITAL_COMMUNITY): Payer: Self-pay

## 2018-07-26 ENCOUNTER — Encounter (HOSPITAL_COMMUNITY)
Admission: RE | Admit: 2018-07-26 | Discharge: 2018-07-26 | Disposition: A | Discharge status: Home / Self Care | Payer: 59 | Source: Ambulatory Visit | Attending: Otolaryngology | Admitting: Otolaryngology

## 2018-07-26 ENCOUNTER — Other Ambulatory Visit: Payer: Self-pay

## 2018-07-26 DIAGNOSIS — Z01812 Encounter for preprocedural laboratory examination: Secondary | ICD-10-CM | POA: Diagnosis not present

## 2018-07-26 HISTORY — DX: Gastro-esophageal reflux disease without esophagitis: K21.9

## 2018-07-26 HISTORY — DX: Adverse effect of unspecified anesthetic, initial encounter: T41.45XA

## 2018-07-26 HISTORY — DX: Other complications of anesthesia, initial encounter: T88.59XA

## 2018-07-26 LAB — BASIC METABOLIC PANEL
ANION GAP: 6 (ref 5–15)
BUN: 10 mg/dL (ref 6–20)
CALCIUM: 9.3 mg/dL (ref 8.9–10.3)
CO2: 20 mmol/L — ABNORMAL LOW (ref 22–32)
Chloride: 110 mmol/L (ref 98–111)
Creatinine, Ser: 0.81 mg/dL (ref 0.44–1.00)
GFR calc Af Amer: >60 mL/min (ref >60)
Glucose, Bld: 90 mg/dL (ref 70–99)
POTASSIUM: 3.7 mmol/L (ref 3.5–5.1)
Sodium: 136 mmol/L (ref 135–145)

## 2018-07-26 LAB — HEMOGLOBIN: HEMOGLOBIN: 15.6 g/dL — AB (ref 12.0–15.0)

## 2018-07-26 NOTE — Progress Notes (Signed)
PCP - Jarrett Sohoourtney Wharton PA-C  Chest x-ray - N/A EKG - requested  Blood Thinner Instructions: N/A  Aspirin Instructions: N/A  Anesthesia review: requested EKG  Patient denies shortness of breath, fever, cough and chest pain at PAT appointment   Patient verbalized understanding of instructions that were given to them at the PAT appointment. Patient was also instructed that they will need to review over the PAT instructions again at home before surgery.

## 2018-07-29 NOTE — Anesthesia Preprocedure Evaluation (Addendum)
Anesthesia Evaluation  Patient identified by MRN, date of birth, ID band Patient awake    Reviewed: Allergy & Precautions, NPO status , Patient's Chart, lab work & pertinent test results  Airway Mallampati: II  TM Distance: >3 FB Neck ROM: Full    Dental no notable dental hx. (+) Teeth Intact, Dental Advisory Given   Pulmonary neg pulmonary ROS,    Pulmonary exam normal breath sounds clear to auscultation       Cardiovascular hypertension, Pt. on medications Normal cardiovascular exam Rhythm:Regular Rate:Normal     Neuro/Psych PSYCHIATRIC DISORDERS Anxiety Depression negative neurological ROS     GI/Hepatic Neg liver ROS, GERD  ,  Endo/Other  negative endocrine ROS  Renal/GU negative Renal ROS     Musculoskeletal negative musculoskeletal ROS (+)   Abdominal (+) + obese,   Peds  Hematology negative hematology ROS (+)   Anesthesia Other Findings   Reproductive/Obstetrics                            Anesthesia Physical Anesthesia Plan  ASA: II  Anesthesia Plan: General   Post-op Pain Management:    Induction: Intravenous  PONV Risk Score and Plan: 3 and Treatment may vary due to age or medical condition, Dexamethasone, Ondansetron, Midazolam and Scopolamine patch - Pre-op  Airway Management Planned: Oral ETT  Additional Equipment:   Intra-op Plan:   Post-operative Plan: Extubation in OR  Informed Consent: I have reviewed the patients History and Physical, chart, labs and discussed the procedure including the risks, benefits and alternatives for the proposed anesthesia with the patient or authorized representative who has indicated his/her understanding and acceptance.   Dental advisory given  Plan Discussed with: CRNA  Anesthesia Plan Comments:         Anesthesia Quick Evaluation

## 2018-07-30 ENCOUNTER — Encounter (HOSPITAL_COMMUNITY): Payer: Self-pay | Admitting: Surgery

## 2018-07-30 ENCOUNTER — Ambulatory Visit (HOSPITAL_COMMUNITY): Payer: 59 | Admitting: Anesthesiology

## 2018-07-30 ENCOUNTER — Other Ambulatory Visit: Payer: Self-pay

## 2018-07-30 ENCOUNTER — Observation Stay (HOSPITAL_COMMUNITY)
Admission: RE | Admit: 2018-07-30 | Discharge: 2018-07-30 | Disposition: A | Discharge status: Home / Self Care | Payer: 59 | Source: Ambulatory Visit | Attending: Otolaryngology | Admitting: Otolaryngology

## 2018-07-30 ENCOUNTER — Encounter (HOSPITAL_COMMUNITY)
Admission: RE | Disposition: A | Discharge status: Home / Self Care | Payer: Self-pay | Source: Ambulatory Visit | Attending: Otolaryngology

## 2018-07-30 ENCOUNTER — Ambulatory Visit (HOSPITAL_COMMUNITY): Payer: 59 | Admitting: Physician Assistant

## 2018-07-30 DIAGNOSIS — G43909 Migraine, unspecified, not intractable, without status migrainosus: Secondary | ICD-10-CM | POA: Insufficient documentation

## 2018-07-30 DIAGNOSIS — F419 Anxiety disorder, unspecified: Secondary | ICD-10-CM | POA: Insufficient documentation

## 2018-07-30 DIAGNOSIS — Z79899 Other long term (current) drug therapy: Secondary | ICD-10-CM | POA: Insufficient documentation

## 2018-07-30 DIAGNOSIS — Z793 Long term (current) use of hormonal contraceptives: Secondary | ICD-10-CM | POA: Diagnosis not present

## 2018-07-30 DIAGNOSIS — J351 Hypertrophy of tonsils: Secondary | ICD-10-CM | POA: Diagnosis not present

## 2018-07-30 DIAGNOSIS — J029 Acute pharyngitis, unspecified: Secondary | ICD-10-CM | POA: Diagnosis not present

## 2018-07-30 DIAGNOSIS — J3501 Chronic tonsillitis: Secondary | ICD-10-CM | POA: Diagnosis not present

## 2018-07-30 DIAGNOSIS — J039 Acute tonsillitis, unspecified: Secondary | ICD-10-CM | POA: Diagnosis present

## 2018-07-30 DIAGNOSIS — F329 Major depressive disorder, single episode, unspecified: Secondary | ICD-10-CM | POA: Diagnosis not present

## 2018-07-30 DIAGNOSIS — I10 Essential (primary) hypertension: Secondary | ICD-10-CM | POA: Insufficient documentation

## 2018-07-30 HISTORY — PX: TONSILLECTOMY: SHX5217

## 2018-07-30 HISTORY — PX: TONSILLECTOMY: SUR1361

## 2018-07-30 LAB — POCT PREGNANCY, URINE: PREG TEST UR: NEGATIVE

## 2018-07-30 SURGERY — TONSILLECTOMY
Anesthesia: General | Site: Mouth | Laterality: Bilateral

## 2018-07-30 MED ORDER — SUCCINYLCHOLINE CHLORIDE 200 MG/10ML IV SOSY
PREFILLED_SYRINGE | INTRAVENOUS | Status: DC | PRN
  Administered 2018-07-30: 80 mg via INTRAVENOUS

## 2018-07-30 MED ORDER — ONDANSETRON HCL 4 MG/2ML IJ SOLN
INTRAMUSCULAR | Status: DC | PRN
  Administered 2018-07-30: 4 mg via INTRAVENOUS

## 2018-07-30 MED ORDER — PROPOFOL 10 MG/ML IV BOLUS
INTRAVENOUS | Status: AC
  Filled 2018-07-30: qty 20

## 2018-07-30 MED ORDER — DEXAMETHASONE SODIUM PHOSPHATE 10 MG/ML IJ SOLN
INTRAMUSCULAR | Status: DC | PRN
  Administered 2018-07-30: 5 mg via INTRAVENOUS

## 2018-07-30 MED ORDER — SCOPOLAMINE 1 MG/3DAYS TD PT72
MEDICATED_PATCH | TRANSDERMAL | Status: DC | PRN
  Administered 2018-07-30: 1 via TRANSDERMAL

## 2018-07-30 MED ORDER — GLYCOPYRROLATE PF 0.2 MG/ML IJ SOSY
PREFILLED_SYRINGE | INTRAMUSCULAR | Status: DC | PRN
  Administered 2018-07-30: .2 mg via INTRAVENOUS

## 2018-07-30 MED ORDER — CHLORHEXIDINE GLUCONATE CLOTH 2 % EX PADS: 6.0000 | MEDICATED_PAD | Freq: Once | CUTANEOUS | Status: DC

## 2018-07-30 MED ORDER — LACTATED RINGERS IV SOLN
INTRAVENOUS | Status: DC | PRN
  Administered 2018-07-30: 07:00:00 via INTRAVENOUS

## 2018-07-30 MED ORDER — MIDAZOLAM HCL 2 MG/2ML IJ SOLN
INTRAMUSCULAR | Status: AC
  Filled 2018-07-30: qty 2

## 2018-07-30 MED ORDER — HYDROCODONE-ACETAMINOPHEN 7.5-325 MG PO TABS: 1.0000 | ORAL_TABLET | Freq: Once | ORAL | Status: DC | PRN

## 2018-07-30 MED ORDER — OXYCODONE HCL 5 MG/5ML PO SOLN: 5.0000 mg | ORAL | 0 refills | Status: DC | PRN

## 2018-07-30 MED ORDER — DROSPIRENONE 4 MG PO TABS: 4.0000 mg | ORAL_TABLET | Freq: Every day | ORAL | Status: DC

## 2018-07-30 MED ORDER — MIDAZOLAM HCL 5 MG/5ML IJ SOLN
INTRAMUSCULAR | Status: DC | PRN
  Administered 2018-07-30: 2 mg via INTRAVENOUS

## 2018-07-30 MED ORDER — FENTANYL CITRATE (PF) 100 MCG/2ML IJ SOLN
25.0000 ug | INTRAMUSCULAR | Status: DC | PRN
  Administered 2018-07-30 (×2): 50 ug via INTRAVENOUS

## 2018-07-30 MED ORDER — PROPOFOL 10 MG/ML IV BOLUS
INTRAVENOUS | Status: DC | PRN
  Administered 2018-07-30: 180 mg via INTRAVENOUS
  Administered 2018-07-30: 20 mg via INTRAVENOUS

## 2018-07-30 MED ORDER — FENTANYL CITRATE (PF) 100 MCG/2ML IJ SOLN
INTRAMUSCULAR | Status: AC
  Filled 2018-07-30: qty 2

## 2018-07-30 MED ORDER — OXYCODONE-ACETAMINOPHEN 5-325 MG PO TABS
1.0000 | ORAL_TABLET | ORAL | Status: DC | PRN
  Filled 2018-07-30: qty 2

## 2018-07-30 MED ORDER — DEXTROSE-NACL 5-0.45 % IV SOLN
INTRAVENOUS | Status: DC
  Administered 2018-07-30: 12:00:00 via INTRAVENOUS

## 2018-07-30 MED ORDER — BACLOFEN 10 MG PO TABS: 10.0000 mg | ORAL_TABLET | Freq: Two times a day (BID) | ORAL | Status: DC | PRN

## 2018-07-30 MED ORDER — KETOROLAC TROMETHAMINE 30 MG/ML IJ SOLN
INTRAMUSCULAR | Status: AC
  Filled 2018-07-30: qty 1

## 2018-07-30 MED ORDER — ZONISAMIDE 100 MG PO CAPS
300.0000 mg | ORAL_CAPSULE | Freq: Every day | ORAL | Status: DC
  Filled 2018-07-30: qty 3

## 2018-07-30 MED ORDER — SERTRALINE HCL 50 MG PO TABS: 75.0000 mg | ORAL_TABLET | Freq: Every day | ORAL | Status: DC

## 2018-07-30 MED ORDER — FENTANYL CITRATE (PF) 250 MCG/5ML IJ SOLN
INTRAMUSCULAR | Status: AC
  Filled 2018-07-30: qty 5

## 2018-07-30 MED ORDER — LIDOCAINE 2% (20 MG/ML) 5 ML SYRINGE
INTRAMUSCULAR | Status: DC | PRN
  Administered 2018-07-30: 100 mg via INTRAVENOUS

## 2018-07-30 MED ORDER — SCOPOLAMINE 1 MG/3DAYS TD PT72
MEDICATED_PATCH | TRANSDERMAL | Status: AC
  Filled 2018-07-30: qty 1

## 2018-07-30 MED ORDER — 0.9 % SODIUM CHLORIDE (POUR BTL) OPTIME
TOPICAL | Status: DC | PRN
  Administered 2018-07-30: 1000 mL

## 2018-07-30 MED ORDER — ACETAMINOPHEN 500 MG PO TABS
1000.0000 mg | ORAL_TABLET | Freq: Once | ORAL | Status: AC
  Administered 2018-07-30: 1000 mg via ORAL
  Filled 2018-07-30: qty 2

## 2018-07-30 MED ORDER — HYDROCODONE-ACETAMINOPHEN 7.5-325 MG/15ML PO SOLN
5.0000 mL | ORAL | Status: DC | PRN
  Administered 2018-07-30 (×2): 5 mL via ORAL
  Filled 2018-07-30 (×2): qty 15

## 2018-07-30 MED ORDER — PROMETHAZINE HCL 25 MG/ML IJ SOLN
6.2500 mg | INTRAMUSCULAR | Status: DC | PRN
Start: 2018-07-30 — End: 2018-07-30

## 2018-07-30 MED ORDER — FENTANYL CITRATE (PF) 100 MCG/2ML IJ SOLN
INTRAMUSCULAR | Status: DC | PRN
  Administered 2018-07-30 (×3): 50 ug via INTRAVENOUS
  Administered 2018-07-30: 100 ug via INTRAVENOUS

## 2018-07-30 MED ORDER — ONDANSETRON HCL 4 MG/2ML IJ SOLN: 4.0000 mg | Freq: Four times a day (QID) | INTRAMUSCULAR | Status: DC | PRN

## 2018-07-30 MED ORDER — ONDANSETRON 4 MG PO TBDP: 4.0000 mg | ORAL_TABLET | Freq: Four times a day (QID) | ORAL | Status: DC | PRN

## 2018-07-30 MED ORDER — KETOROLAC TROMETHAMINE 30 MG/ML IJ SOLN
30.0000 mg | Freq: Once | INTRAMUSCULAR | Status: AC | PRN
  Administered 2018-07-30: 30 mg via INTRAVENOUS

## 2018-07-30 SURGICAL SUPPLY — 37 items
BLADE SURG 10 STRL SS (BLADE) ×2 IMPLANT
BLADE SURG 15 STRL LF DISP TIS (BLADE) IMPLANT
BLADE SURG 15 STRL SS (BLADE)
CANISTER SUCT 3000ML PPV (MISCELLANEOUS) ×2 IMPLANT
CATH ROBINSON RED A/P 10FR (CATHETERS) IMPLANT
CLEANER TIP ELECTROSURG 2X2 (MISCELLANEOUS) ×2 IMPLANT
COAGULATOR SUCT SWTCH 10FR 6 (ELECTROSURGICAL) ×2 IMPLANT
COVER WAND RF STERILE (DRAPES) ×2 IMPLANT
CRADLE DONUT ADULT HEAD (MISCELLANEOUS) IMPLANT
DRAPE HALF SHEET 40X57 (DRAPES) IMPLANT
ELECT COATED BLADE 2.86 ST (ELECTRODE) ×2 IMPLANT
ELECT REM PT RETURN 9FT ADLT (ELECTROSURGICAL)
ELECT REM PT RETURN 9FT PED (ELECTROSURGICAL)
ELECTRODE REM PT RETRN 9FT PED (ELECTROSURGICAL) IMPLANT
ELECTRODE REM PT RTRN 9FT ADLT (ELECTROSURGICAL) IMPLANT
GAUZE 4X4 16PLY RFD (DISPOSABLE) ×2 IMPLANT
GLOVE SS BIOGEL STRL SZ 7.5 (GLOVE) ×1 IMPLANT
GLOVE SUPERSENSE BIOGEL SZ 7.5 (GLOVE) ×1
GOWN STRL REUS W/ TWL LRG LVL3 (GOWN DISPOSABLE) ×2 IMPLANT
GOWN STRL REUS W/TWL LRG LVL3 (GOWN DISPOSABLE) ×4
KIT BASIN OR (CUSTOM PROCEDURE TRAY) ×2 IMPLANT
KIT TURNOVER KIT B (KITS) ×2 IMPLANT
NEEDLE HYPO 25GX1X1/2 BEV (NEEDLE) IMPLANT
NS IRRIG 1000ML POUR BTL (IV SOLUTION) ×2 IMPLANT
PACK SURGICAL SETUP 50X90 (CUSTOM PROCEDURE TRAY) ×2 IMPLANT
PAD ARMBOARD 7.5X6 YLW CONV (MISCELLANEOUS) ×4 IMPLANT
PENCIL FOOT CONTROL (ELECTRODE) ×2 IMPLANT
SPECIMEN JAR SMALL (MISCELLANEOUS) ×4 IMPLANT
SPONGE TONSIL TAPE 1 RFD (DISPOSABLE) ×2 IMPLANT
SYR BULB 3OZ (MISCELLANEOUS) ×2 IMPLANT
TOWEL OR 17X24 6PK STRL BLUE (TOWEL DISPOSABLE) ×4 IMPLANT
TUBE CONNECTING 12X1/4 (SUCTIONS) ×2 IMPLANT
TUBE SALEM SUMP 10F W/ARV (TUBING) IMPLANT
TUBE SALEM SUMP 12R W/ARV (TUBING) IMPLANT
TUBE SALEM SUMP 14F W/ARV (TUBING) IMPLANT
TUBE SALEM SUMP 16 FR W/ARV (TUBING) IMPLANT
WATER STERILE IRR 1000ML POUR (IV SOLUTION) ×2 IMPLANT

## 2018-07-30 NOTE — Progress Notes (Signed)
Pt new admit from PACU s/p bilateral tonsillectomy, alert and oriented with husband at the bedside, with ice pack and given pain meds, she here for observation can be discharge in the evening if no complications.

## 2018-07-30 NOTE — Anesthesia Procedure Notes (Signed)
Procedure Name: Intubation Date/Time: 07/30/2018 7:37 AM Performed by: Mayer CamelBarr, Cherylee Rawlinson S, CRNA Pre-anesthesia Checklist: Patient identified, Emergency Drugs available, Suction available and Patient being monitored Patient Re-evaluated:Patient Re-evaluated prior to induction Oxygen Delivery Method: Circle System Utilized Preoxygenation: Pre-oxygenation with 100% oxygen Induction Type: IV induction Ventilation: Mask ventilation without difficulty Laryngoscope Size: Miller and 2 Grade View: Grade I Tube type: Oral Tube size: 7.0 mm Number of attempts: 1 Airway Equipment and Method: Stylet and Oral airway Placement Confirmation: ETT inserted through vocal cords under direct vision,  positive ETCO2 and breath sounds checked- equal and bilateral Secured at: 22 cm Tube secured with: Tape Dental Injury: Teeth and Oropharynx as per pre-operative assessment

## 2018-07-30 NOTE — Transfer of Care (Signed)
Immediate Anesthesia Transfer of Care Note  Patient: Tanya Gross  Procedure(s) Performed: TONSILLECTOMY, bilateral (Bilateral Mouth)  Patient Location: PACU  Anesthesia Type:General  Level of Consciousness: awake, alert  and oriented  Airway & Oxygen Therapy: Patient Spontanous Breathing and Patient connected to nasal cannula oxygen  Post-op Assessment: Report given to RN and Post -op Vital signs reviewed and stable  Post vital signs: Reviewed and stable  Last Vitals:  Vitals Value Taken Time  BP 101/72 07/30/2018  8:16 AM  Temp 36.7 C 07/30/2018  8:15 AM  Pulse 106 07/30/2018  8:18 AM  Resp 10 07/30/2018  8:18 AM  SpO2 94 % 07/30/2018  8:18 AM  Vitals shown include unvalidated device data.  Last Pain:  Vitals:   07/30/18 0617  TempSrc:   PainSc: 0-No pain      Patients Stated Pain Goal: 1 (07/30/18 0617)  Complications: No apparent anesthesia complications

## 2018-07-30 NOTE — Op Note (Signed)
Preop/postop diagnosis: Chronic tonsillitis Procedure: Tonsillectomy Estimated blood loss: Less than 5 mL Indications: 27 year old repetitive tonsillitis episodes been refractory medical therapy. She was informed risks and benefits of the procedure and options were discussed all questions are answered and consent was obtained. Procedure: Patient was taken up in places supine position after general endotracheal tube anesthesia was placed in the rose position. Draped in usual sterile manner. The Crowe-Davis mouth gag was inserted retracted and suspended from the Mayo stand. Left tonsil begun making a left anterior tonsillar pillar incision identifying the capsule tonsil and removing it with electrocautery dissection along the capsule. The right tonsil removed in the same fashion. Suction cautery was used to gain hemostasis in both tonsillar fossa. The hypopharynx esophagus stomach were suctioned NG tube. The Crowe-Davis was released and resuspended and there is hemostasis present in all locations. Patient was then removed the Crowe-Davis and awake and brought to recovery in stable condition counts correct

## 2018-07-30 NOTE — Progress Notes (Signed)
Pt for discharge today going home, husband at the bedside, s/p tonsillectomy, hoarse voice, alert and oriented , urinate, able to walk tolerates clear liquid, given pain meds, discontinued peripheral IV line, given health teachings, next appointment, prescriptions, due med explained and understood, given all her personal belongings.

## 2018-07-30 NOTE — H&P (Signed)
Tanya Gross is an 27 y.o. female.   Chief Complaint: chronic tonsillitis HPI: hx of recurrent tonsil infections  Past Medical History:  Diagnosis Date  . Anxiety   . Complication of anesthesia    very anxious and had difficulty catching her breath  . Depression   . GERD (gastroesophageal reflux disease)   . Gestational edema affecting puerperium 10/04/2016  . HPV in female   . Hx of migraines   . Hypertension    on meds prior to pregnancy  . Pregnancy induced hypertension     Past Surgical History:  Procedure Laterality Date  . FINGER SURGERY    . WISDOM TOOTH EXTRACTION      History reviewed. No pertinent family history. Social History:  reports that she has never smoked. She has never used smokeless tobacco. She reports that she does not drink alcohol or use drugs.  Allergies:  Allergies  Allergen Reactions  . Cefdinir Anaphylaxis and Hives    Medications Prior to Admission  Medication Sig Dispense Refill  . baclofen (LIORESAL) 10 MG tablet Take 10 mg by mouth 2 (two) times daily as needed (for migraines. Max of 7 doses per month.).    . Drospirenone (SLYND) 4 MG TABS Take 4 mg by mouth at bedtime.    . sertraline (ZOLOFT) 50 MG tablet Take 75 mg by mouth at bedtime.    Marland Kitchen. zonisamide (ZONEGRAN) 100 MG capsule Take 300 mg by mouth at bedtime.      Results for orders placed or performed during the hospital encounter of 07/30/18 (from the past 48 hour(s))  Pregnancy, urine POC     Status: None   Collection Time: 07/30/18  6:04 AM  Result Value Ref Range   Preg Test, Ur NEGATIVE NEGATIVE    Comment:        THE SENSITIVITY OF THIS METHODOLOGY IS >24 mIU/mL    No results found.  Review of Systems  Constitutional: Negative.   HENT: Negative.   Eyes: Negative.   Respiratory: Negative.   Cardiovascular: Negative.   Skin: Negative.     Blood pressure 120/74, pulse 87, temperature 98.1 F (36.7 C), temperature source Oral, resp. rate 20, last menstrual  period 07/16/2018, SpO2 98 %, unknown if currently breastfeeding. Physical Exam  Constitutional: She appears well-nourished.  HENT:  Head: Normocephalic.  Nose: Nose normal.  Mouth/Throat: Oropharynx is clear and moist.  Eyes: Pupils are equal, round, and reactive to light. Conjunctivae and EOM are normal.  Neck: Normal range of motion. Neck supple.  Cardiovascular: Normal rate.  Respiratory: Effort normal.  GI: Soft.  Musculoskeletal: Normal range of motion.     Assessment/Plan Chronic tonsillitis- discussed tonsillectomy and ready to proceed  Suzanna ObeyJohn George Alcantar, MD 07/30/2018, 7:29 AM

## 2018-07-30 NOTE — Anesthesia Postprocedure Evaluation (Signed)
Anesthesia Post Note  Patient: Tanya Gross  Procedure(s) Performed: TONSILLECTOMY, bilateral (Bilateral Mouth)     Patient location during evaluation: PACU Anesthesia Type: General Level of consciousness: awake and alert Pain management: pain level controlled Vital Signs Assessment: post-procedure vital signs reviewed and stable Respiratory status: spontaneous breathing, nonlabored ventilation, respiratory function stable and patient connected to nasal cannula oxygen Cardiovascular status: blood pressure returned to baseline and stable Postop Assessment: no apparent nausea or vomiting Anesthetic complications: no    Last Vitals:  Vitals:   07/30/18 1115 07/30/18 1147  BP: 106/74 106/72  Pulse: 95 90  Resp: 17 16  Temp: 36.6 C 37.4 C  SpO2: 94% 97%    Last Pain:  Vitals:   07/30/18 1316  TempSrc:   PainSc: 0-No pain                 Trevor IhaStephen A Shila Kruczek

## 2018-07-31 ENCOUNTER — Encounter (HOSPITAL_COMMUNITY): Payer: Self-pay | Admitting: Otolaryngology

## 2018-07-31 LAB — HIV ANTIBODY (ROUTINE TESTING W REFLEX): HIV SCREEN 4TH GENERATION: NONREACTIVE

## 2018-08-13 DIAGNOSIS — R6889 Other general symptoms and signs: Secondary | ICD-10-CM | POA: Diagnosis not present

## 2018-08-16 NOTE — Discharge Summary (Signed)
Admission dx: chronic tonsillitis Discharge dx; same Procedure; tonsillectomy Hospital coarse: admitted for tonsillectomy and stayed the observation and was discharge to home. She had no bleeding and taking po well. Follow up in 2-3 weeks. No consults or inventions Disposition: follow up in 3 weeks sooner if any questions bleeding or not drinking.

## 2018-08-17 DIAGNOSIS — R05 Cough: Secondary | ICD-10-CM | POA: Diagnosis not present

## 2018-09-24 DIAGNOSIS — B373 Candidiasis of vulva and vagina: Secondary | ICD-10-CM | POA: Diagnosis not present

## 2018-09-24 DIAGNOSIS — R350 Frequency of micturition: Secondary | ICD-10-CM | POA: Diagnosis not present

## 2019-06-21 ENCOUNTER — Other Ambulatory Visit: Payer: Self-pay | Admitting: Family Medicine

## 2019-06-21 DIAGNOSIS — R109 Unspecified abdominal pain: Secondary | ICD-10-CM

## 2019-06-26 ENCOUNTER — Other Ambulatory Visit: Payer: 59

## 2019-07-03 ENCOUNTER — Other Ambulatory Visit: Payer: 59

## 2019-11-02 ENCOUNTER — Ambulatory Visit: Payer: Self-pay | Attending: Internal Medicine

## 2019-11-02 DIAGNOSIS — Z23 Encounter for immunization: Secondary | ICD-10-CM | POA: Insufficient documentation

## 2019-11-02 NOTE — Progress Notes (Signed)
   Covid-19 Vaccination Clinic  Name:  Tanya Gross    MRN: [id number] DOB: 03-Nov-1990  11/02/2019  [name] was observed post Covid-19 immunization for 15 minutes without incidence. She was provided with Vaccine Information Sheet and instruction to access the V-Safe system.   [name] was instructed to call 911 with any severe reactions post vaccine: Marland Kitchen Difficulty breathing  . Swelling of your face and throat  . A fast heartbeat  . A bad rash all over your body  . Dizziness and weakness    Immunizations Administered    Name Date Dose VIS Date Route   Pfizer COVID-19 Vaccine 11/02/2019  6:32 PM 0.3 mL 08/16/2019 Intramuscular   Manufacturer: [employer], [employer]   Lot: [id number]   NDC: [address]-3

## 2019-11-23 ENCOUNTER — Ambulatory Visit: Payer: Self-pay | Attending: Internal Medicine

## 2019-11-23 DIAGNOSIS — Z23 Encounter for immunization: Secondary | ICD-10-CM

## 2019-11-23 NOTE — Progress Notes (Signed)
   Covid-19 Vaccination Clinic  Name:  Tanya Gross    MRN: [id number] DOB: 1991/08/21  11/23/2019  [name] was observed post Covid-19 immunization for 15 minutes without incident. She was provided with Vaccine Information Sheet and instruction to access the V-Safe system.   [name] was instructed to call 911 with any severe reactions post vaccine: Marland Kitchen Difficulty breathing  . Swelling of face and throat  . A fast heartbeat  . A bad rash all over body  . Dizziness and weakness   Immunizations Administered    Name Date Dose VIS Date Route   Pfizer COVID-19 Vaccine 11/23/2019 11:27 AM 0.3 mL 08/16/2019 Intramuscular   Manufacturer: [employer], [employer]   Lot: [id number]   NDC: [address]-4

## 2020-06-07 ENCOUNTER — Emergency Department (HOSPITAL_BASED_OUTPATIENT_CLINIC_OR_DEPARTMENT_OTHER)
Admission: EM | Admit: 2020-06-07 | Discharge: 2020-06-08 | Disposition: A | Discharge status: Home / Self Care | Payer: BC Managed Care – PPO | Attending: Emergency Medicine | Admitting: Emergency Medicine

## 2020-06-07 ENCOUNTER — Emergency Department (HOSPITAL_BASED_OUTPATIENT_CLINIC_OR_DEPARTMENT_OTHER): Payer: BC Managed Care – PPO

## 2020-06-07 ENCOUNTER — Other Ambulatory Visit: Payer: Self-pay

## 2020-06-07 ENCOUNTER — Encounter (HOSPITAL_BASED_OUTPATIENT_CLINIC_OR_DEPARTMENT_OTHER): Payer: Self-pay | Admitting: Emergency Medicine

## 2020-06-07 DIAGNOSIS — R091 Pleurisy: Secondary | ICD-10-CM | POA: Diagnosis not present

## 2020-06-07 DIAGNOSIS — I10 Essential (primary) hypertension: Secondary | ICD-10-CM | POA: Insufficient documentation

## 2020-06-07 DIAGNOSIS — Z20822 Contact with and (suspected) exposure to covid-19: Secondary | ICD-10-CM | POA: Insufficient documentation

## 2020-06-07 DIAGNOSIS — R079 Chest pain, unspecified: Secondary | ICD-10-CM | POA: Diagnosis present

## 2020-06-07 DIAGNOSIS — R1011 Right upper quadrant pain: Secondary | ICD-10-CM

## 2020-06-07 DIAGNOSIS — Z79899 Other long term (current) drug therapy: Secondary | ICD-10-CM | POA: Diagnosis not present

## 2020-06-07 DIAGNOSIS — K219 Gastro-esophageal reflux disease without esophagitis: Secondary | ICD-10-CM | POA: Diagnosis not present

## 2020-06-07 LAB — URINALYSIS, ROUTINE W REFLEX MICROSCOPIC
Bilirubin Urine: NEGATIVE
Glucose, UA: NEGATIVE mg/dL
Hgb urine dipstick: NEGATIVE
Ketones, ur: NEGATIVE mg/dL
Nitrite: NEGATIVE
Protein, ur: NEGATIVE mg/dL
Specific Gravity, Urine: >1.03 — ABNORMAL HIGH (ref 1.005–1.030)
pH: 5.5 (ref 5.0–8.0)

## 2020-06-07 LAB — LIPASE, BLOOD: Lipase: 34 U/L (ref 11–51)

## 2020-06-07 LAB — RESPIRATORY PANEL BY RT PCR (FLU A&B, COVID)
Influenza A by PCR: NEGATIVE
Influenza B by PCR: NEGATIVE
SARS Coronavirus 2 by RT PCR: NEGATIVE

## 2020-06-07 LAB — PREGNANCY, URINE: Preg Test, Ur: NEGATIVE

## 2020-06-07 LAB — URINALYSIS, MICROSCOPIC (REFLEX): RBC / HPF: NONE SEEN RBC/hpf (ref 0–5)

## 2020-06-07 LAB — HEPATIC FUNCTION PANEL
ALT: 14 U/L (ref 0–44)
AST: 16 U/L (ref 15–41)
Albumin: 4.2 g/dL (ref 3.5–5.0)
Alkaline Phosphatase: 45 U/L (ref 38–126)
Bilirubin, Direct: 0.1 mg/dL (ref 0.0–0.2)
Indirect Bilirubin: 0.2 mg/dL — ABNORMAL LOW (ref 0.3–0.9)
Total Bilirubin: 0.3 mg/dL (ref 0.3–1.2)
Total Protein: 7 g/dL (ref 6.5–8.1)

## 2020-06-07 LAB — BASIC METABOLIC PANEL
Anion gap: 9 (ref 5–15)
BUN: 11 mg/dL (ref 6–20)
CO2: 23 mmol/L (ref 22–32)
Calcium: 9 mg/dL (ref 8.9–10.3)
Chloride: 108 mmol/L (ref 98–111)
Creatinine, Ser: 0.82 mg/dL (ref 0.44–1.00)
GFR calc Af Amer: >60 mL/min (ref >60)
GFR calc non Af Amer: >60 mL/min (ref >60)
Glucose, Bld: 108 mg/dL — ABNORMAL HIGH (ref 70–99)
Potassium: 3.3 mmol/L — ABNORMAL LOW (ref 3.5–5.1)
Sodium: 140 mmol/L (ref 135–145)

## 2020-06-07 LAB — CBC
HCT: 45.1 % (ref 36.0–46.0)
Hemoglobin: 15.5 g/dL — ABNORMAL HIGH (ref 12.0–15.0)
MCH: 29.4 pg (ref 26.0–34.0)
MCHC: 34.4 g/dL (ref 30.0–36.0)
MCV: 85.4 fL (ref 80.0–100.0)
Platelets: 179 10*3/uL (ref 150–400)
RBC: 5.28 MIL/uL — ABNORMAL HIGH (ref 3.87–5.11)
RDW: 11.6 % (ref 11.5–15.5)
WBC: 6.8 10*3/uL (ref 4.0–10.5)
nRBC: 0 % (ref 0.0–0.2)

## 2020-06-07 LAB — TROPONIN I (HIGH SENSITIVITY): Troponin I (High Sensitivity): <2 ng/L (ref <18)

## 2020-06-07 LAB — D-DIMER, QUANTITATIVE: D-Dimer, Quant: 0.28 ug/mL-FEU (ref 0.00–0.50)

## 2020-06-07 MED ORDER — IOHEXOL 300 MG/ML  SOLN
100.0000 mL | Freq: Once | INTRAMUSCULAR | Status: AC | PRN
  Administered 2020-06-07: 100 mL via INTRAVENOUS

## 2020-06-07 MED ORDER — IBUPROFEN 400 MG PO TABS: 400.0000 mg | ORAL_TABLET | Freq: Four times a day (QID) | ORAL | 0 refills | Status: DC | PRN

## 2020-06-07 MED ORDER — KETOROLAC TROMETHAMINE 30 MG/ML IJ SOLN
30.0000 mg | Freq: Once | INTRAMUSCULAR | Status: AC
  Administered 2020-06-07: 30 mg via INTRAVENOUS
  Filled 2020-06-07: qty 1

## 2020-06-07 MED ORDER — PREDNISONE 20 MG PO TABS: 40.0000 mg | ORAL_TABLET | Freq: Every day | ORAL | 0 refills | Status: DC

## 2020-06-07 NOTE — ED Notes (Signed)
Pt states began having a fever one week ago and tested negative for covid, now having migraines, which she has a hx of and is on meds for. Her 30 yr old is positive for COVID. Having a strong non-prod coughs. Was retested approx 3 days ago and tested negative again. Now having chest pain. EDP at bedside. States now having chest pain and nausea and vomiting. Having some diarrhea. Pain is more at RUQ , under rt breast. Pain increases with deep breaths.

## 2020-06-07 NOTE — ED Triage Notes (Addendum)
Pt states she has multiple family members with covid. Reports now chest and back pain, HTN, fever, HA, diarrhea, vomiting, and cough. Pt did have Pfizer vaccine, completed in March. Symptoms started ~ 1 week ago.

## 2020-06-07 NOTE — ED Provider Notes (Signed)
[facility] EMERGENCY DEPARTMENT Provider Note   CSN: [id number] Arrival date & time: 06/07/20  1911     History Chief Complaint  Patient presents with  . Chest Pain    Tanya Gross is a 29 y.o. female.  HPI Patient presents with fever abdominal pain and chest pain.  Has had for around the last week.  States that many family members including her 68-year-old have been positive for Covid.  She however has had 2 - PCR test recently.  Coughs at times.  Around 3 days ago had a negative Covid test again.  States that her pain is worse with movement.  Worse with deep breaths.  Goes through to the back.  No dysuria.  Has had some diarrhea.    Past Medical History:  Diagnosis Date  . Anxiety   . Complication of anesthesia    very anxious and had difficulty catching her breath  . Depression   . GERD (gastroesophageal reflux disease)   . Gestational edema affecting puerperium 10/04/2016  . HPV in female   . Hx of migraines   . Hypertension    on meds prior to pregnancy  . Pregnancy induced hypertension     Patient Active Problem List   Diagnosis Date Noted  . Tonsillitis 07/30/2018  . Gestational edema affecting puerperium 10/04/2016  . Gestational thrombocytopenia (HCC) 10/02/2016  . SVD (spontaneous vaginal delivery) 10/01/2016  . Postpartum care following vaginal delivery (1/27) 10/01/2016  . Anxiety and depression 10/01/2016  . Chronic hypertension affecting pregnancy 10/01/2016  . Rh negative, maternal 10/01/2016    Past Surgical History:  Procedure Laterality Date  . FINGER SURGERY    . TONSILLECTOMY  07/30/2018  . TONSILLECTOMY Bilateral 07/30/2018   Procedure: TONSILLECTOMY, bilateral;  Surgeon: Suzanna Obey, MD;  Location: [facility] OR;  Service: ENT;  Laterality: Bilateral;  . WISDOM TOOTH EXTRACTION       OB History    Gravida  1   Para  1   Term  1   Preterm  0   AB  0   Living  1     SAB  0   TAB  0   Ectopic  0   Multiple  0    Live Births  1           No family history on file.  Social History   Tobacco Use  . Smoking status: Never Smoker  . Smokeless tobacco: Never Used  Vaping Use  . Vaping Use: Never used  Substance Use Topics  . Alcohol use: No  . Drug use: No    Home Medications Prior to Admission medications   Medication Sig Start Date End Date Taking? Authorizing Provider  baclofen (LIORESAL) 10 MG tablet Take 10 mg by mouth 2 (two) times daily as needed (for migraines. Max of 7 doses per month.).   Yes [provider]  TIZANIDINE HCL PO Take by mouth.   Yes [provider]  Drospirenone (SLYND) 4 MG TABS Take 4 mg by mouth at bedtime.    [provider]  ibuprofen (ADVIL) 400 MG tablet Take 1 tablet (400 mg total) by mouth every 6 (six) hours as needed. 06/07/20   Benjiman Core, MD  oxyCODONE (ROXICODONE) 5 MG/5ML solution Take 5 mLs (5 mg total) by mouth every 4 (four) hours as needed for severe pain. 07/30/18   Suzanna Obey, MD  predniSONE (DELTASONE) 20 MG tablet Take 2 tablets (40 mg total) by mouth daily. 06/07/20  Benjiman Core, MD  sertraline (ZOLOFT) 50 MG tablet Take 75 mg by mouth at bedtime.    [provider]  zonisamide (ZONEGRAN) 100 MG capsule Take 300 mg by mouth at bedtime.    [provider]    Allergies    Cefdinir  Review of Systems   Review of Systems  Constitutional: Positive for appetite change, fatigue and fever.  HENT: Negative for congestion.   Respiratory: Positive for shortness of breath.   Cardiovascular: Positive for chest pain.  Gastrointestinal: Positive for abdominal pain.  Genitourinary: Negative for flank pain.  Musculoskeletal: Positive for back pain.  Skin: Negative for rash.  Neurological: Negative for weakness.    Physical Exam Updated Vital Signs BP (!) 139/101 (BP Location: Left Arm)   Pulse 76   Resp 18   LMP 05/07/2020   SpO2 100%   Physical Exam Vitals and nursing note reviewed.   HENT:     Head: Normocephalic.  Cardiovascular:     Rate and Rhythm: Normal rate.  Pulmonary:     Breath sounds: No wheezing or rhonchi.  Chest:     Comments: Mild tenderness to anterior lower chest wall. Abdominal:     Comments: Right upper quadrant tenderness.  No hernia palpated.  No rebound or guarding.  Musculoskeletal:     Right lower leg: No tenderness. No edema.     Left lower leg: No tenderness. No edema.  Skin:    General: Skin is warm.     Capillary Refill: Capillary refill takes less than 2 seconds.  Neurological:     Mental Status: She is alert and oriented to person, place, and time.     ED Results / Procedures / Treatments   Labs (all labs ordered are listed, but only abnormal results are displayed) Labs Reviewed  BASIC METABOLIC PANEL - Abnormal; Notable for the following components:      Result Value   Potassium 3.3 (*)    Glucose, Bld 108 (*)    All other components within normal limits  CBC - Abnormal; Notable for the following components:   RBC 5.28 (*)    Hemoglobin 15.5 (*)    All other components within normal limits  HEPATIC FUNCTION PANEL - Abnormal; Notable for the following components:   Indirect Bilirubin 0.2 (*)    All other components within normal limits  URINALYSIS, ROUTINE W REFLEX MICROSCOPIC - Abnormal; Notable for the following components:   Specific Gravity, Urine >1.030 (*)    Leukocytes,Ua TRACE (*)    All other components within normal limits  URINALYSIS, MICROSCOPIC (REFLEX) - Abnormal; Notable for the following components:   Bacteria, UA MANY (*)    Crystals PRESENT (*)    All other components within normal limits  RESPIRATORY PANEL BY RT PCR (FLU A&B, COVID)  PREGNANCY, URINE  D-DIMER, QUANTITATIVE (NOT AT [facility]  LIPASE, BLOOD  TROPONIN I (HIGH SENSITIVITY)    EKG EKG Interpretation  Date/Time:  Sunday June 07 2020 21:24:45 EDT Ventricular Rate:  79 PR Interval:    QRS Duration: 99 QT Interval:  362 QTC  Calculation: 415 R Axis:   34 Text Interpretation: Sinus rhythm Confirmed by Benjiman Core [phone]) on 06/07/2020 9:58:46 PM   Radiology CT ABDOMEN PELVIS W CONTRAST  Result Date: 06/07/2020 CLINICAL DATA:  Chest pain and back pain.  COVID exposure. EXAM: CT ABDOMEN AND PELVIS WITH CONTRAST TECHNIQUE: Multidetector CT imaging of the abdomen and pelvis was performed using the standard protocol following bolus administration of intravenous  contrast. CONTRAST:  OMNIPAQUE IOHEXOL 300 MG/ML  SOLN COMPARISON:  None recent FINDINGS: Lower chest: There are trace bilateral pleural effusions.The heart size is normal. Hepatobiliary: The liver is normal. Normal gallbladder.There is no biliary ductal dilation. Pancreas: Normal contours without ductal dilatation. No peripancreatic fluid collection. Spleen: Unremarkable. Adrenals/Urinary Tract: --Adrenal glands: Unremarkable. --Right kidney/ureter: No hydronephrosis or radiopaque kidney stones. --Left kidney/ureter: There is a punctate nonobstructing stone in the interpolar region of the left kidney. --Urinary bladder: Unremarkable. Stomach/Bowel: --Stomach/Duodenum: No hiatal hernia or other gastric abnormality. Normal duodenal course and caliber. --Small bowel: Unremarkable. --Colon: Unremarkable. --Appendix: Normal. Vascular/Lymphatic: Normal course and caliber of the major abdominal vessels. --No retroperitoneal lymphadenopathy. --No mesenteric lymphadenopathy. --No pelvic or inguinal lymphadenopathy. Reproductive: Unremarkable Other: No ascites or free air. The abdominal wall is normal. Musculoskeletal. No acute displaced fractures. IMPRESSION: 1. No acute abdominopelvic abnormality. 2. Trace bilateral pleural effusions. 3. Punctate nonobstructing stone in the interpolar region of the left kidney. Electronically Signed   By: Katherine Mantle M.D.   On: 06/07/2020 22:18   DG Chest Portable 1 View  Result Date: 06/07/2020 CLINICAL DATA:  Fever and cough x1  week. EXAM: PORTABLE CHEST 1 VIEW COMPARISON:  Jan 14, 2020 FINDINGS: The heart size and mediastinal contours are within normal limits. Both lungs are clear. The visualized skeletal structures are unremarkable. IMPRESSION: No active disease. Electronically Signed   By: Aram Candela M.D.   On: 06/07/2020 20:31    Procedures Procedures (including critical care time)  Medications Ordered in ED Medications  iohexol (OMNIPAQUE) 300 MG/ML solution 100 mL (100 mLs Intravenous Contrast Given 06/07/20 2202)  ketorolac (TORADOL) 30 MG/ML injection 30 mg (30 mg Intravenous Given 06/07/20 2348)    ED Course  I have reviewed the triage vital signs and the nursing notes.  Pertinent labs & imaging results that were available during my care of the patient were reviewed by me and considered in my medical decision making (see chart for details).    MDM Rules/Calculators/A&P                          Patient with right-sided chest and abdominal pain.  Goes to back.  Has had exposures to Covid but has had now 3 negative PCR tests.  X-ray reassuring.  CT scan done due to abdominal pain.  Did not show clear cause of the pain.  Does have left-sided renal stone although pain is on the right side.  No biliary abnormality seen.  Potentially could be pleurisy.  With Covid-like symptoms she had with negative test potentially also could be a pleurisy even if she did not have Covid.  Does not appear to be cardiac cause.  Doubt pulmonary embolism with her negative D-dimer.  Will discharge home with short course of steroids to hopefully help with the pain if it is pleurisy.  Outpatient follow-up as needed. Final Clinical Impression(s) / ED Diagnoses Final diagnoses:  Right upper quadrant abdominal pain  Pleurisy    Rx / DC Orders ED Discharge Orders         Ordered    predniSONE (DELTASONE) 20 MG tablet  Daily        06/07/20 2356    ibuprofen (ADVIL) 400 MG tablet  Every 6 hours PRN        06/07/20 2356            Benjiman Core, MD 06/08/20 0004

## 2021-02-15 ENCOUNTER — Ambulatory Visit (INDEPENDENT_AMBULATORY_CARE_PROVIDER_SITE_OTHER): Payer: BC Managed Care – PPO

## 2021-02-15 ENCOUNTER — Ambulatory Visit: Payer: BC Managed Care – PPO | Admitting: Cardiology

## 2021-02-15 ENCOUNTER — Other Ambulatory Visit: Payer: Self-pay

## 2021-02-15 ENCOUNTER — Encounter: Payer: Self-pay | Admitting: Cardiology

## 2021-02-15 DIAGNOSIS — I479 Paroxysmal tachycardia, unspecified: Secondary | ICD-10-CM

## 2021-02-15 DIAGNOSIS — R42 Dizziness and giddiness: Secondary | ICD-10-CM

## 2021-02-15 DIAGNOSIS — R001 Bradycardia, unspecified: Secondary | ICD-10-CM | POA: Diagnosis not present

## 2021-02-15 NOTE — Progress Notes (Unsigned)
Patient enrolled for [employer] to mail a 7 day ZIO XT monitor to her home.

## 2021-02-15 NOTE — Progress Notes (Signed)
Primary Care Provider: Jarrett Soho, PA-C Cardiologist: None Electrophysiologist: None  Clinic Note: Chief Complaint  Patient presents with   New Patient (Initial Visit)    Palpitations, dizziness and high blood pressure.   Palpitations    Episodes of fast HR spells associated with dizziness    ===================================  ASSESSMENT/PLAN   Problem List Items Addressed This Visit     Symptomatic bradycardia    I am not sure if she is having termination pauses or just simply sinus bradycardia.  Need to determine what the true arrhythmia is.  Plan: 7-day Zio patch monitor       Relevant Orders   EKG 12-Lead (Completed)   LONG TERM MONITOR (3-14 DAYS)   Nonspecific dizziness   Relevant Orders   EKG 12-Lead (Completed)   LONG TERM MONITOR (3-14 DAYS)   Paroxysmal tachycardia (HCC) (Chronic)    Interestingly, she seems to be having both tachycardia and bradycardia issues.  She seems to be more symptomatic with bradycardia and tachycardia.  She may very well be having posttermination pauses.  Need to determine what the tachycardia is in order to better understand the bradycardia.  As such, I would prefer to avoid treating with any type of AV nodal agents until we are sure what the actual arrhythmia is.  Plan: 7-day Zio patch monitor.  Pending evaluation with monitor, may consider echocardiogram although she did not have any exam findings to suggest mitral valve prolapse.       Relevant Orders   EKG 12-Lead (Completed)   LONG TERM MONITOR (3-14 DAYS)   ===================================  HPI:    Tanya Gross is a 30 y.o. female with a PMH notable for anxiety, fatigue, sleep disturbance and ADHD (previously on Adderall) below who is seen today for evaluation of TACHYCARDIA (HR 140-150s), and INTERMITTENT DIZZINESS.  At the request of Jarrett Soho, PA.  Tanya Gross was last seen on June 7 by Ms. Wharton, PA for 63-month follow-up with  complaints of high blood pressure and palpitations.  She had been seen back in December 2021 with concerns of possible anxiety and ADD.  Noted to be a type A personality, but having issues with organization, easy distraction etc.  Noted difficulty with focusing.  Anxiety has been well controlled with Zoloft.  ADHD has been relatively controlled with 50 mg Adderall.  But still having some issues. -> For her June visit, she noted that Adderall was helping tremendously but that the lower dose was wearing off in the afternoon.  She was taking it twice a day and doing better.  However she was noticing her heart rate on her telephone app was increasing to the 160s associate with some dizziness.  This is happening multiple times a day.  She also noted some increased blood pressures.  Recent Hospitalizations: None  Reviewed  CV studies:    The following studies were reviewed today: (if available, images/films reviewed: From Epic Chart or Care Everywhere) None:  Interval History:   Tanya Gross is here today for cardiology evaluation.  She says that she has been noting feeling fatigued for over a year now.  She had a negative sleep study evaluation not that long ago.  She was given a diagnosis of possible ADHD, but recently after her last visit, they decided to stop Adderall and try using Vyvanse again, but she has not yet restarted.  She tells me that for the last few months he has been having episodes where her heart rate  can go up very high (highest recorded was about 200 on her watch) but usually up in the 140s to 160s.  These episodes can last several minutes up to 15-20 minutes and then are followed occasionally by low heart rates down to the 30s and 40s.  She says it really except for the excessive fast heart rate spells she did not really feel that bad (certainly noticed the heart rates greater than 180 bpm with some shortness of breath and fatigue), however mostly what she feels is very dizzy but  unable to do anything when the heart rate is low.  She has felt like she may pass out, but has not actually passed out.  She says the spells can happen maybe 3-4 times a day, has slowed down a little bit from a month ago where they were happening multiple times a day.  But in the last 2 weeks has been 3-4 times a day.  She has been concerned because her heart rate sometimes goes down when she is active as opposed to going up.  On occasion she is woken up with her heart rate going fast having a sensation of her heart quivering, and then she can get back to sleep.  Except when her heart rate was going really fast she did not notice any chest discomfort or dyspnea.  No resting exertional dyspnea.  No PND, orthopnea or edema.  Although she has felt lightheaded and dizzy-as though she may pass out, she has not truly passed out.  She has not been off Adderall long enough yet to see if it has been helping.  But it seems like things are slowing down some.  CV Review of Symptoms (Summary): positive for - palpitations, rapid heart rate, and dizziness, difficulty concentration, some chest discomfort and dyspnea.  Persistent fatigue.  Difficulty sleeping.  Sometimes chest aches when goes fast.  Also notes low heart rate spells after fast heart rate spells.  This is more when she feels dizzy. negative for - chest pain, dyspnea on exertion, edema, irregular heartbeat, loss of consciousness, orthopnea, paroxysmal nocturnal dyspnea, shortness of breath, or although she may have felt some near syncope, no true syncope.  No TIA/amaurosis fugax, claudication  REVIEWED OF SYSTEMS   Review of Systems  Constitutional:  Positive for malaise/fatigue. Negative for weight loss.  HENT:  Negative for congestion and nosebleeds.   Respiratory:  Negative for shortness of breath (Only with palpitations).   Cardiovascular:  Positive for palpitations. Negative for leg swelling.  Gastrointestinal:  Negative for blood in stool and  melena.  Genitourinary:  Negative for dysuria and hematuria.  Musculoskeletal:  Negative for falls and joint pain.  Neurological:  Positive for dizziness and tingling (With fast heart rate).  Psychiatric/Behavioral:  Positive for depression (Pretty well controlled). Negative for memory loss. The patient is nervous/anxious and has insomnia.    I have reviewed and (if needed) personally updated the patient's problem list, medications, allergies, past medical and surgical history, social and family history.   PAST MEDICAL HISTORY   Past Medical History:  Diagnosis Date   Adult ADHD (attention deficit hyperactivity disorder)    Anxiety    Complication of anesthesia    very anxious and had difficulty catching her breath   Depression    GERD (gastroesophageal reflux disease)    Gestational edema affecting puerperium 10/04/2016   HPV in female    Hx of migraines    Hypertension    on meds prior to pregnancy  Pregnancy induced hypertension     PAST SURGICAL HISTORY   Past Surgical History:  Procedure Laterality Date   FINGER SURGERY     TONSILLECTOMY Bilateral 07/30/2018   Procedure: TONSILLECTOMY, bilateral;  Surgeon: Suzanna Obey, MD;  Location: [facility] OR;  Service: ENT;  Laterality: Bilateral;   WISDOM TOOTH EXTRACTION      Immunization History  Administered Date(s) Administered   PFIZER(Purple Top)SARS-COV-2 Vaccination 11/02/2019, 11/23/2019    MEDICATIONS/ALLERGIES   Current Meds  Medication Sig   sertraline (ZOLOFT) 50 MG tablet Take 75 mg by mouth at bedtime.   TIZANIDINE HCL PO Take by mouth as needed.   zonisamide (ZONEGRAN) 100 MG capsule Take 300 mg by mouth at bedtime.    Allergies  Allergen Reactions   Cefdinir Anaphylaxis and Hives    SOCIAL HISTORY/FAMILY HISTORY   Reviewed in Epic:  Pertinent findings:  Social History   Tobacco Use   Smoking status: Never   Smokeless tobacco: Never  Vaping Use   Vaping Use: Never used  Substance Use Topics    Alcohol use: No   Drug use: No   Social History   Social History Narrative   Married mother of 1.  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    OBJCTIVE -PE, EKG, labs   Wt Readings from Last 3 Encounters:  02/15/21 196 lb 12.8 oz (89.3 kg)  07/26/18 186 lb 8 oz (84.6 kg)  09/30/16 221 lb (100.2 kg)    Physical Exam: BP 110/75   Pulse 88   Ht 5\' 5"  (1.651 m)   Wt 196 lb 12.8 oz (89.3 kg)   SpO2 98%   BMI 32.75 kg/m  Physical Exam Constitutional:      General: She is not in acute distress.    Appearance: Normal appearance. She is obese. She is not ill-appearing or toxic-appearing.  HENT:     Head: Normocephalic and atraumatic.  Neck:     Vascular: No carotid bruit, hepatojugular reflux or JVD.  Cardiovascular:     Rate and Rhythm: Normal rate and regular rhythm. No extrasystoles are present.    Chest Wall: PMI is not displaced.     Pulses: Normal pulses.     Heart sounds: Normal heart sounds. No midsystolic click and no opening snap.    No friction rub. No gallop. No S4 sounds.  Pulmonary:     Effort: Pulmonary effort is normal. No respiratory distress.     Breath sounds: Normal breath sounds.  Musculoskeletal:        General: No swelling. Normal range of motion.     Cervical back: Normal range of motion and neck supple.  Skin:    General: Skin is warm and dry.  Neurological:     General: No focal deficit present.     Mental Status: She is oriented to person, place, and time.  Psychiatric:        Mood and Affect: Mood normal.        Behavior: Behavior normal.        Thought Content: Thought content normal.        Judgment: Judgment normal.     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     Adult ECG Report -   Rate: 88;  Rhythm: normal sinus rhythm and normal axis, intervals and durations. ;   Narrative Interpretation: Normal EKG  Recent Labs: Reviewed - 02/09/21: Cr 0.81, was told recent Thyroid levels were normal.  No results  found for: CHOL, HDL, LDLCALC, LDLDIRECT, TRIG, CHOLHDL Lab Results  Component Value Date   CREATININE 0.82 06/07/2020   BUN 11 06/07/2020   NA 140 06/07/2020   K 3.3 (L) 06/07/2020   CL 108 06/07/2020   CO2 23 06/07/2020   CBC Latest Ref Rng & Units 06/07/2020 07/26/2018 10/03/2016  WBC 4.0 - 10.5 K/uL 6.8 - 9.0  Hemoglobin 12.0 - 15.0 g/dL 15.5(H) 15.6(H) 13.7  Hematocrit 36.0 - 46.0 % 45.1 - 39.8  Platelets 150 - 400 K/uL 179 - 163    No results found for: TSH - recently checked by OB - was told that levels were OK  ==================================================  COVID-19 Education: The signs and symptoms of COVID-19 were discussed with the patient and how to seek care for testing (follow up with PCP or arrange E-visit).    I spent a total of 28  minutes with the patient spent in direct patient consultation.  Additional time spent with chart review  / charting (studies, outside notes, etc): 16 min Total Time: 44 min  Current medicines are reviewed at length with the patient today.  (+/- concerns) n/a  This visit occurred during the SARS-CoV-2 public health emergency.  Safety protocols were in place, including screening questions prior to the visit, additional usage of staff PPE, and extensive cleaning of exam room while observing appropriate contact time as indicated for disinfecting solutions.  Notice: This dictation was prepared with Dragon dictation along with smaller phrase technology. Any transcriptional errors that result from this process are unintentional and may not be corrected upon review.  Patient Instructions / Medication Changes & Studies & Tests Ordered   Patient Instructions  Medication Instructions:  Not needed *If you need a refill on your cardiac medications before your next appointment, please call your pharmacy*   Lab Work: Not needed  Testing/Procedures: Your physician has recommended that you wear a holter monitor 7 day Zio . Holter monitors  are medical devices that record the heart's electrical activity. Doctors most often use these monitors to diagnose arrhythmias. Arrhythmias are problems with the speed or rhythm of the heartbeat. The monitor is a small, portable device. You can wear one while you do your normal daily activities. This is usually used to diagnose what is causing palpitations/syncope (passing out).    Follow-Up: At [facility], you and your health needs are our priority.  As part of our continuing mission to provide you with exceptional heart care, we have created designated Provider Care Teams.  These Care Teams include your primary Cardiologist (physician) and Advanced Practice Providers (APPs -  Physician Assistants and Nurse Practitioners) who all work together to provide you with the care you need, when you need it.  We recommend signing up for the patient portal called "MyChart".  Sign up information is provided on this After Visit Summary.  MyChart is used to connect with patients for Virtual Visits (Telemedicine).  Patients are able to view lab/test results, encounter notes, upcoming appointments, etc.  Non-urgent messages can be sent to your provider as well.   To learn more about what you can do with MyChart, go to [email or web address].    Your next appointment:   2 month(s)  The format for your next appointment:   Virtual Visit   Provider:   Bryan Lemma, MD   Other Instructions  Recommendations for vagal maneuvers when  you have your episodes of palpations "Bearing down" Coughing Gagging Icy, cold towel on face or drink ice cold water  Drink  - hydrate  as well as eat something - especial drinks something with electrolytes   ZIO XT- Long Term Monitor Instructions  Your physician has requested you wear a ZIO patch monitor for 14 days.  This is a single patch monitor. [employer] supplies one patch monitor per enrollment. Additional stickers are not available. Please do not apply patch  if you will be having a Nuclear Stress Test,  Echocardiogram, Cardiac CT, MRI, or Chest Xray during the period you would be wearing the  monitor. The patch cannot be worn during these tests. You cannot remove and re-apply the  ZIO XT patch monitor.  Your ZIO patch monitor will be mailed 3 day USPS to your address on file. It may take 3-5 days  to receive your monitor after you have been enrolled.  Once you have received your monitor, please review the enclosed instructions. Your monitor  has already been registered assigning a specific monitor serial # to you.  Billing and Patient Assistance Program Information  We have supplied [employer] with any of your insurance information on file for billing purposes. [employer] offers a sliding scale Patient Assistance Program for patients that do not have  insurance, or whose insurance does not completely cover the cost of the ZIO monitor.  You must apply for the Patient Assistance Program to qualify for this discounted rate.  To apply, please call [employer] at [phone], select option 4, select option 2, ask to apply for  Patient Assistance Program. [employer]eth [employer] will ask your household income, and how many people  are in your household. They will quote your out-of-pocket cost based on that information.  [employer] will also be able to set up a 8186-month, interest-free payment plan if needed.  Applying the monitor   Shave hair from upper left chest.  Hold abrader disc by orange tab. Rub abrader in 40 strokes over the upper left chest as  indicated in your monitor instructions.  Clean area with 4 enclosed alcohol pads. Let dry.  Apply patch as indicated in monitor instructions. Patch will be placed under collarbone on left  side of chest with arrow pointing upward.  Rub patch adhesive wings for 2 minutes. Remove white label marked "1". Remove the white  label marked "2". Rub patch adhesive wings for 2 additional minutes.  While looking in a mirror, press and  release button in center of patch. A small green light will  flash 3-4 times. This will be your only indicator that the monitor has been turned on.  Do not shower for the first 24 hours. You may shower after the first 24 hours.  Press the button if you feel a symptom. You will hear a small click. Record Date, Time and  Symptom in the Patient Logbook.  When you are ready to remove the patch, follow instructions on the last 2 pages of Patient  Logbook. Stick patch monitor onto the last page of Patient Logbook.  Place Patient Logbook in the blue and white box. Use locking tab on box and tape box closed  securely. The blue and white box has prepaid postage on it. Please place it in the mailbox as  soon as possible. Your physician should have your test results approximately 7 days after the  monitor has been mailed back to [facility].  Call [facility] Customer Care at [phone] if you have questions regarding  your ZIO XT patch monitor. Call them immediately if you see an orange light blinking on your  monitor.  If your monitor falls off in less than 4 days, contact our Monitor department at [phone].  If your monitor becomes loose or falls off after 4 days call [employer] at [phone] for  suggestions on securing your monitor    Studies Ordered:   Orders Placed This Encounter  Procedures   LONG TERM MONITOR (3-14 DAYS)   EKG 12-Lead     Bryan Lemma, M.D., M.S. Interventional Cardiologist   Pager # [phone] Phone # [phone] [address] [place], [place] [address]   Thank you for choosing Heart[facility] at [facility]!!

## 2021-02-15 NOTE — Patient Instructions (Signed)
Medication Instructions:  Not needed *If you need a refill on your cardiac medications before your next appointment, please call your pharmacy*   Lab Work: Not needed  Testing/Procedures: Your physician has recommended that you wear a holter monitor 7 day Zio . Holter monitors are medical devices that record the heart's electrical activity. Doctors most often use these monitors to diagnose arrhythmias. Arrhythmias are problems with the speed or rhythm of the heartbeat. The monitor is a small, portable device. You can wear one while you do your normal daily activities. This is usually used to diagnose what is causing palpitations/syncope (passing out).    Follow-Up: At [facility], you and your health needs are our priority.  As part of our continuing mission to provide you with exceptional heart care, we have created designated Provider Care Teams.  These Care Teams include your primary Cardiologist (physician) and Advanced Practice Providers (APPs -  Physician Assistants and Nurse Practitioners) who all work together to provide you with the care you need, when you need it.  We recommend signing up for the patient portal called "MyChart".  Sign up information is provided on this After Visit Summary.  MyChart is used to connect with patients for Virtual Visits (Telemedicine).  Patients are able to view lab/test results, encounter notes, upcoming appointments, etc.  Non-urgent messages can be sent to your provider as well.   To learn more about what you can do with MyChart, go to [email or web address].    Your next appointment:   2 month(s)  The format for your next appointment:   Virtual Visit   Provider:   Bryan Lemma, MD   Other Instructions  Recommendations for vagal maneuvers when  you have your episodes of palpations "Bearing down" Coughing Gagging Icy, cold towel on face or drink ice cold water    Drink  - hydrate  as well as eat something - especial drinks something  with electrolytes   ZIO XT- Long Term Monitor Instructions  Your physician has requested you wear a ZIO patch monitor for 14 days.  This is a single patch monitor. [employer] supplies one patch monitor per enrollment. Additional stickers are not available. Please do not apply patch if you will be having a Nuclear Stress Test,  Echocardiogram, Cardiac CT, MRI, or Chest Xray during the period you would be wearing the  monitor. The patch cannot be worn during these tests. You cannot remove and re-apply the  ZIO XT patch monitor.  Your ZIO patch monitor will be mailed 3 day USPS to your address on file. It may take 3-5 days  to receive your monitor after you have been enrolled.  Once you have received your monitor, please review the enclosed instructions. Your monitor  has already been registered assigning a specific monitor serial # to you.  Billing and Patient Assistance Program Information  We have supplied [employer] with any of your insurance information on file for billing purposes. [employer] offers a sliding scale Patient Assistance Program for patients that do not have  insurance, or whose insurance does not completely cover the cost of the ZIO monitor.  You must apply for the Patient Assistance Program to qualify for this discounted rate.  To apply, please call [employer] at [phone], select option 4, select option 2, ask to apply for  Patient Assistance Program. Meredeth Ide will ask your household income, and how many people  are in your household. They will quote your out-of-pocket cost based on that information.  [employer] will also be  able to set up a 35-month, interest-free payment plan if needed.  Applying the monitor   Shave hair from upper left chest.  Hold abrader disc by orange tab. Rub abrader in 40 strokes over the upper left chest as  indicated in your monitor instructions.  Clean area with 4 enclosed alcohol pads. Let dry.  Apply patch as indicated in monitor instructions. Patch  will be placed under collarbone on left  side of chest with arrow pointing upward.  Rub patch adhesive wings for 2 minutes. Remove white label marked "1". Remove the white  label marked "2". Rub patch adhesive wings for 2 additional minutes.  While looking in a mirror, press and release button in center of patch. A small green light will  flash 3-4 times. This will be your only indicator that the monitor has been turned on.  Do not shower for the first 24 hours. You may shower after the first 24 hours.  Press the button if you feel a symptom. You will hear a small click. Record Date, Time and  Symptom in the Patient Logbook.  When you are ready to remove the patch, follow instructions on the last 2 pages of Patient  Logbook. Stick patch monitor onto the last page of Patient Logbook.  Place Patient Logbook in the blue and white box. Use locking tab on box and tape box closed  securely. The blue and white box has prepaid postage on it. Please place it in the mailbox as  soon as possible. Your physician should have your test results approximately 7 days after the  monitor has been mailed back to [facility].  Call [facility] Customer Care at [phone] if you have questions regarding  your ZIO XT patch monitor. Call them immediately if you see an orange light blinking on your  monitor.  If your monitor falls off in less than 4 days, contact our Monitor department at [phone].  If your monitor becomes loose or falls off after 4 days call [employer] at [phone] for  suggestions on securing your monitor

## 2021-02-20 ENCOUNTER — Encounter: Payer: Self-pay | Admitting: Cardiology

## 2021-02-20 NOTE — Assessment & Plan Note (Signed)
I am not sure if she is having termination pauses or just simply sinus bradycardia.  Need to determine what the true arrhythmia is.  Plan: 7-day Zio patch monitor

## 2021-02-20 NOTE — Assessment & Plan Note (Addendum)
Interestingly, she seems to be having both tachycardia and bradycardia issues.  She seems to be more symptomatic with bradycardia and tachycardia.  She may very well be having posttermination pauses.  Need to determine what the tachycardia is in order to better understand the bradycardia.  As such, I would prefer to avoid treating with any type of AV nodal agents until we are sure what the actual arrhythmia is.  We did discuss Vagal maneuvers.   Plan: 7-day Zio patch monitor.  Pending evaluation with monitor, may consider echocardiogram although she did not have any exam findings to suggest mitral valve prolapse.

## 2021-02-25 DIAGNOSIS — R42 Dizziness and giddiness: Secondary | ICD-10-CM

## 2021-02-25 DIAGNOSIS — R001 Bradycardia, unspecified: Secondary | ICD-10-CM

## 2021-02-25 DIAGNOSIS — I479 Paroxysmal tachycardia, unspecified: Secondary | ICD-10-CM

## 2021-03-11 ENCOUNTER — Ambulatory Visit: Payer: BC Managed Care – PPO | Admitting: Cardiology

## 2021-04-02 ENCOUNTER — Telehealth: Payer: BC Managed Care – PPO | Admitting: Cardiology

## 2021-04-23 ENCOUNTER — Ambulatory Visit: Payer: BC Managed Care – PPO | Admitting: Endocrinology

## 2021-06-07 ENCOUNTER — Ambulatory Visit: Payer: BC Managed Care – PPO | Admitting: Endocrinology

## 2021-07-09 ENCOUNTER — Other Ambulatory Visit: Payer: Self-pay

## 2021-07-09 ENCOUNTER — Ambulatory Visit (INDEPENDENT_AMBULATORY_CARE_PROVIDER_SITE_OTHER): Payer: BC Managed Care – PPO | Admitting: Endocrinology

## 2021-07-09 DIAGNOSIS — R5383 Other fatigue: Secondary | ICD-10-CM

## 2021-07-09 MED ORDER — COSYNTROPIN 0.25 MG IJ SOLR
0.2500 mg | Freq: Once | INTRAMUSCULAR | Status: AC
  Administered 2021-07-09: 0.25 mg via INTRAVENOUS

## 2021-07-09 NOTE — Progress Notes (Signed)
Subjective:    Patient ID: Tanya Gross, female    DOB: July 26, 1991, 30 y.o.   MRN: [id number]  HPI Pt is referred by Jarrett Soho, PA, for sxs of fatigue x 3 years.  Pt was noted to have borderline low cortisol level in 2022.  She also has weight gain, hair loss on the head, intermitt lightheadedness, and difficulty with concentration.  no h/o abdominal or brain injury.  No h/o cancer, thyroid problems, seizures, hypoglycemia, amyloidosis, tuberculosis, or diabetes.  No recent steroids.  No h/o ketoconazole, rifampin, or dilantin.  She has reg menses.  Cardiol eval was neg.  She had n/v approx 5 times, but last time was 7/22.   Past Medical History:  Diagnosis Date   Adult ADHD (attention deficit hyperactivity disorder)    Anxiety    Complication of anesthesia    very anxious and had difficulty catching her breath   Depression    GERD (gastroesophageal reflux disease)    Gestational edema affecting puerperium 10/04/2016   HPV in female    Hx of migraines    Hypertension    on meds prior to pregnancy   Pregnancy induced hypertension     Past Surgical History:  Procedure Laterality Date   FINGER SURGERY     TONSILLECTOMY Bilateral 07/30/2018   Procedure: TONSILLECTOMY, bilateral;  Surgeon: Suzanna Obey, MD;  Location: [facility] OR;  Service: ENT;  Laterality: Bilateral;   WISDOM TOOTH EXTRACTION      Social History   Socioeconomic History   Marital status: Married    Spouse name: Not on file   Number of children: 1   Years of education: Not on file   Highest education level: Bachelor's degree (e.g., BA, AB, BS)  Occupational History    Employer: [employer] generation   Occupation: Runner, broadcasting/film/video    Comment: [employer]  Tobacco Use   Smoking status: Never   Smokeless tobacco: Never  Vaping Use   Vaping Use: Never used  Substance and Sexual Activity   Alcohol use: No   Drug use: No   Sexual activity: Yes  Other Topics Concern   Not on file  Social History Narrative    Married mother of 1.  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   Social Determinants of Health   Financial Resource Strain: Not on file  Food Insecurity: Not on file  Transportation Needs: Not on file  Physical Activity: Not on file  Stress: Not on file  Social Connections: Not on file  Intimate Partner Violence: Not on file    Current Outpatient Medications on File Prior to Visit  Medication Sig Dispense Refill   sertraline (ZOLOFT) 50 MG tablet Take 75 mg by mouth at bedtime.     TIZANIDINE HCL PO Take by mouth as needed.     zonisamide (ZONEGRAN) 100 MG capsule Take 300 mg by mouth at bedtime.     No current facility-administered medications on file prior to visit.    Allergies  Allergen Reactions   Cefdinir Anaphylaxis and Hives    Family History  Problem Relation Age of Onset   Healthy Mother    Thyroid disease Mother    Cerebral palsy Sister    Atrial fibrillation Maternal Grandmother    Stroke Maternal Grandfather    Depression Maternal Aunt     BP 136/80 (BP Location: Right Arm, Patient Position: Sitting, Cuff Size: Large)   Pulse 96   Ht 5\' 5"  (1.651 m)   Wt  198 lb 12.8 oz (90.2 kg)   SpO2 98%   BMI 33.08 kg/m    Review of Systems Denies abd pain, cold intolerance, vitiligo.  She reports darkening of the skin tone.      Objective:   Physical Exam VS: see vs page GEN: no distress HEAD: head: no deformity eyes: no periorbital swelling, no proptosis external nose and ears are normal NECK: supple, thyroid is not enlarged CHEST WALL: no deformity LUNGS: clear to auscultation CV: reg rate and rhythm, no murmur.  MUSCULOSKELETAL: gait is normal and steady EXTEMITIES: no deformity.  1+ bilat leg edema NEURO:  readily moves all 4's.  sensation is intact to touch on all 4's SKIN:  Normal texture and temperature.  No rash or suspicious lesion is visible.   NODES:  None palpable at the neck PSYCH: alert, well-oriented.  Does not appear anxious nor  depressed.    Cortisol (2022)=5 (12N)  CT (2021) normal adrenals.   Lab Results  Component Value Date   CREATININE 0.82 06/07/2020   BUN 11 06/07/2020   NA 140 06/07/2020   K 3.3 (L) 06/07/2020   CL 108 06/07/2020   CO2 23 06/07/2020   Lab Results  Component Value Date   CALCIUM 9.0 06/07/2020    Sleep study: normal.    ACTH stimulation test is done: baseline cortisol level=3 then Cosyntropin 250 mcg is given im 45 minutes later, cortisol level=19 (normal response)  Lab Results  Component Value Date   TSH 2.20 07/09/2021   I have reviewed outside records, and summarized: Pt was noted to have above sxs, and referred here.  Multiple labs did not find a cause, and mult meds have not improved sxs.       Assessment & Plan:  Fatigue, uncertain etiology and prognosis.    Patient Instructions  Blood tests are requested for you today.  We'll let you know about the results.

## 2021-07-09 NOTE — Patient Instructions (Signed)
Blood tests are requested for you today.  We'll let you know about the results.  

## 2021-07-10 ENCOUNTER — Encounter: Payer: Self-pay | Admitting: Endocrinology

## 2021-07-10 LAB — CORTISOL: Cortisol, Plasma: 19.4 ug/dL

## 2021-07-13 LAB — ACTH: C206 ACTH: 10 pg/mL (ref 6–50)

## 2021-07-13 LAB — CORTISOL: Cortisol, Plasma: 3.4 ug/dL

## 2021-07-13 LAB — TSH: TSH: 2.2 mIU/L

## 2021-07-13 LAB — T4, FREE: Free T4: 1 ng/dL (ref 0.8–1.8)

## 2021-10-05 ENCOUNTER — Encounter (HOSPITAL_BASED_OUTPATIENT_CLINIC_OR_DEPARTMENT_OTHER): Payer: Self-pay

## 2021-10-05 ENCOUNTER — Other Ambulatory Visit: Payer: Self-pay

## 2021-10-05 ENCOUNTER — Emergency Department (HOSPITAL_BASED_OUTPATIENT_CLINIC_OR_DEPARTMENT_OTHER): Payer: BC Managed Care – PPO

## 2021-10-05 ENCOUNTER — Emergency Department (HOSPITAL_BASED_OUTPATIENT_CLINIC_OR_DEPARTMENT_OTHER)
Admission: EM | Admit: 2021-10-05 | Discharge: 2021-10-05 | Disposition: A | Discharge status: Home / Self Care | Payer: BC Managed Care – PPO | Attending: Emergency Medicine | Admitting: Emergency Medicine

## 2021-10-05 DIAGNOSIS — I10 Essential (primary) hypertension: Secondary | ICD-10-CM | POA: Diagnosis not present

## 2021-10-05 DIAGNOSIS — R079 Chest pain, unspecified: Secondary | ICD-10-CM | POA: Insufficient documentation

## 2021-10-05 DIAGNOSIS — R55 Syncope and collapse: Secondary | ICD-10-CM | POA: Insufficient documentation

## 2021-10-05 DIAGNOSIS — R112 Nausea with vomiting, unspecified: Secondary | ICD-10-CM | POA: Diagnosis not present

## 2021-10-05 DIAGNOSIS — R000 Tachycardia, unspecified: Secondary | ICD-10-CM | POA: Diagnosis not present

## 2021-10-05 LAB — CBC
HCT: 45.8 % (ref 36.0–46.0)
Hemoglobin: 16 g/dL — ABNORMAL HIGH (ref 12.0–15.0)
MCH: 29.9 pg (ref 26.0–34.0)
MCHC: 34.9 g/dL (ref 30.0–36.0)
MCV: 85.6 fL (ref 80.0–100.0)
Platelets: 176 10*3/uL (ref 150–400)
RBC: 5.35 MIL/uL — ABNORMAL HIGH (ref 3.87–5.11)
RDW: 11.9 % (ref 11.5–15.5)
WBC: 5.6 10*3/uL (ref 4.0–10.5)
nRBC: 0 % (ref 0.0–0.2)

## 2021-10-05 LAB — BASIC METABOLIC PANEL
Anion gap: 8 (ref 5–15)
BUN: 13 mg/dL (ref 6–20)
CO2: 25 mmol/L (ref 22–32)
Calcium: 9 mg/dL (ref 8.9–10.3)
Chloride: 103 mmol/L (ref 98–111)
Creatinine, Ser: 0.66 mg/dL (ref 0.44–1.00)
GFR, Estimated: >60 mL/min (ref >60)
Glucose, Bld: 100 mg/dL — ABNORMAL HIGH (ref 70–99)
Potassium: 4.1 mmol/L (ref 3.5–5.1)
Sodium: 136 mmol/L (ref 135–145)

## 2021-10-05 LAB — TROPONIN I (HIGH SENSITIVITY)
Troponin I (High Sensitivity): <2 ng/L (ref <18)
Troponin I (High Sensitivity): <2 ng/L (ref <18)

## 2021-10-05 MED ORDER — NITROGLYCERIN 0.4 MG SL SUBL
0.4000 mg | SUBLINGUAL_TABLET | Freq: Once | SUBLINGUAL | Status: AC
  Administered 2021-10-05: 0.4 mg via SUBLINGUAL
  Filled 2021-10-05: qty 1

## 2021-10-05 MED ORDER — ONDANSETRON HCL 4 MG/2ML IJ SOLN
4.0000 mg | Freq: Once | INTRAMUSCULAR | Status: AC
  Administered 2021-10-05: 4 mg via INTRAVENOUS
  Filled 2021-10-05: qty 2

## 2021-10-05 MED ORDER — LACTATED RINGERS IV BOLUS
1000.0000 mL | Freq: Once | INTRAVENOUS | Status: AC
  Administered 2021-10-05: 1000 mL via INTRAVENOUS

## 2021-10-05 MED ORDER — LACTATED RINGERS IV BOLUS: 1000.0000 mL | Freq: Once | INTRAVENOUS | Status: DC

## 2021-10-05 MED ORDER — KETOROLAC TROMETHAMINE 15 MG/ML IJ SOLN
15.0000 mg | Freq: Once | INTRAMUSCULAR | Status: AC
  Administered 2021-10-05: 15 mg via INTRAVENOUS
  Filled 2021-10-05: qty 1

## 2021-10-05 NOTE — Discharge Instructions (Addendum)
You were seen in the ER today for your variable heart rate and your chest pain. Your physical exam, vital signs, blood work, EKG, and chest x-ray were very reassuring.  While the exact cause of your symptoms remains unclear there does not appear to be any emergent problem at this time.  Please follow-up with the cardiologist's office at the listed appointment time below.  Return to the ER with any new severe symptoms.  A referral was placed to electrophysiology - their office should contact you within 2 business days to set up an appointment.

## 2021-10-05 NOTE — ED Provider Notes (Signed)
[facility] EMERGENCY DEPARTMENT Provider Note   CSN: [id number] Arrival date & time: 10/05/21  0946     History  Chief Complaint  Patient presents with   Chest Pain    Tanya Gross is a 31 y.o. female who presents with concern for chest pain that started this morning.  She is a was driving and she began to have severe needle prick type pain under her left breast.  Radiates to her left neck and arm when she moves her left arm.  Associated nausea and vomiting. Patient states that she has a history of irregular heart rate , Saw cardiology, Dr. Ellyn Hack in the past.  ZIO monitor showed sinus rhythm with variable rates ranging from 49-1 61 with an average heart rate of 89.  Intermittent episodes of Mobitz type I second-degree heart block.  No arrhythmia.  Patient states that she has syncopized in the past secondary to bradycardic episodes with heart rate into the 30s.  She states that she was not prescribed any medication from cardiologist nor she told to follow-up with them.  I personally reviewed patient's medical records.  She has history of anxiety and hypertension.  She states that she had an episode last week where she felt similar to today and when EMS arrived she was told she was in a hypertensive crisis but refused transport to the hospital.  She is not on any antihypertensive medication.  HPI     Home Medications Prior to Admission medications   Medication Sig Start Date End Date Taking? Authorizing Provider  atomoxetine (STRATTERA) 10 MG capsule Take 10 mg by mouth daily.   Yes [provider]  sertraline (ZOLOFT) 50 MG tablet Take 75 mg by mouth at bedtime.    [provider]  TIZANIDINE HCL PO Take by mouth as needed.    [provider]  zonisamide (ZONEGRAN) 100 MG capsule Take 300 mg by mouth at bedtime.    [provider]      Allergies    Cefdinir    Review of Systems   Review of Systems  Constitutional: Negative.    HENT: Negative.    Eyes: Negative.   Respiratory:  Positive for shortness of breath.   Cardiovascular:  Positive for chest pain and palpitations.  Gastrointestinal:  Positive for nausea and vomiting.  Genitourinary: Negative.   Musculoskeletal: Negative.   Skin: Negative.   Neurological: Negative.    Physical Exam Updated Vital Signs BP 113/80 (BP Location: Right Arm)    Pulse 89    Temp 98.1 F (36.7 C)    Resp 16    Ht 5\' 6"  (1.676 m)    Wt 88 kg    LMP 09/24/2021    SpO2 100%    BMI 31.31 kg/m  Physical Exam Vitals and nursing note reviewed.  Constitutional:      Appearance: She is not toxic-appearing.  HENT:     Head: Normocephalic and atraumatic.     Nose: Nose normal.     Mouth/Throat:     Mouth: Mucous membranes are moist.     Pharynx: Oropharynx is clear. Uvula midline. No oropharyngeal exudate or posterior oropharyngeal erythema.  Eyes:     General: Lids are normal. Vision grossly intact.        Right eye: No discharge.        Left eye: No discharge.     Extraocular Movements: Extraocular movements intact.     Conjunctiva/sclera: Conjunctivae normal.     Pupils:  Pupils are equal, round, and reactive to light.  Neck:     Trachea: Trachea and phonation normal.  Cardiovascular:     Rate and Rhythm: Regular rhythm. Tachycardia present.     Pulses: Normal pulses.     Heart sounds: Normal heart sounds. No murmur heard.    Comments: Tachycardic with heart rate ranging between 125 and 105, waxing and waning throughout my evaluation.  No murmurs, no irregular rhythm Pulmonary:     Effort: Pulmonary effort is normal. No tachypnea, bradypnea, accessory muscle usage, prolonged expiration or respiratory distress.     Breath sounds: Normal breath sounds. No wheezing or rales.  Chest:     Chest wall: No mass, lacerations, deformity, swelling, tenderness, crepitus or edema.  Abdominal:     General: Bowel sounds are normal. There is no distension.     Palpations: Abdomen is  soft.     Tenderness: There is no abdominal tenderness. There is no right CVA tenderness, left CVA tenderness, guarding or rebound.  Musculoskeletal:        General: No deformity.     Cervical back: Normal range of motion and neck supple.     Right lower leg: No edema.     Left lower leg: No edema.  Lymphadenopathy:     Cervical: No cervical adenopathy.  Skin:    General: Skin is warm and dry.     Capillary Refill: Capillary refill takes less than 2 seconds.  Neurological:     Mental Status: She is alert. Mental status is at baseline.  Psychiatric:        Mood and Affect: Mood normal.    ED Results / Procedures / Treatments   Labs (all labs ordered are listed, but only abnormal results are displayed) Labs Reviewed  BASIC METABOLIC PANEL - Abnormal; Notable for the following components:      Result Value   Glucose, Bld 100 (*)    All other components within normal limits  CBC - Abnormal; Notable for the following components:   RBC 5.35 (*)    Hemoglobin 16.0 (*)    All other components within normal limits  TROPONIN I (HIGH SENSITIVITY)  TROPONIN I (HIGH SENSITIVITY)    EKG EKG Interpretation  Date/Time:  Tuesday October 05 2021 09:54:26 EST Ventricular Rate:  91 PR Interval:  137 QRS Duration: 94 QT Interval:  348 QTC Calculation: 429 R Axis:   -22 Text Interpretation: Sinus rhythm Borderline left axis deviation Confirmed by Octaviano Glow [phone]) on 10/05/2021 10:29:52 AM  Radiology DG Chest 2 View  Result Date: 10/05/2021 CLINICAL DATA:  Chest pain EXAM: CHEST - 2 VIEW COMPARISON:  Chest radiograph dated June 07, 2020 FINDINGS: The heart size and mediastinal contours are within normal limits. Both lungs are clear. The visualized skeletal structures are unremarkable. IMPRESSION: No active cardiopulmonary disease. Electronically Signed   By: Keane Police D.O.   On: 10/05/2021 10:24    Procedures Procedures   Medications Ordered in ED Medications   nitroGLYCERIN (NITROSTAT) SL tablet 0.4 mg (0.4 mg Sublingual Given 10/05/21 1051)  ondansetron (ZOFRAN) injection 4 mg (4 mg Intravenous Given 10/05/21 1051)  ketorolac (TORADOL) 15 MG/ML injection 15 mg (15 mg Intravenous Given 10/05/21 1158)  ondansetron (ZOFRAN) injection 4 mg (4 mg Intravenous Given 10/05/21 1245)  lactated ringers bolus 1,000 mL (0 mLs Intravenous Stopped 10/05/21 1502)    ED Course/ Medical Decision Making/ A&P  Medical Decision Making 31 year old female presents with concern for chest pain and variable heart rate as well as near syncope this morning.  Differential diagnosis includes resolving to ACS, PE, pleural effusion, pneumothorax, pneumonia, dysrhythmia.  Tachycardic to the 110's on intake, otherwise vital signs are normal.  Cardiopulmonary exam revealed tachycardia with regular rhythm though rate was variable between 100-125 throughout my exam.  Pulmonary exam is normal.  Patient is neurovascular intact in all 4 extremities.  She is clutching her chest throughout exam and pain is clearly exacerbated during pulmonary exam with deep inspiration.    Amount and/or Complexity of Data Reviewed Labs: ordered.    Details: CBC unremarkable, BMP unremarkable, troponin is negative, less than 2. Patient refused upreg or ua. Radiology: ordered.    Details: Chest x-ray negative for acute cardiopulmonary disease ECG/medicine tests:     Details: EKG with normal sinus rhythm.  Risk Prescription drug management.  Patient ambulated in the emergency department with maintenance of her heart rate between mid 80s and mid 90s, with minutes for oxygen saturation.   She continues to have some left-sided chest pain though this was improved after in a who Toradol.  Heart rhythm has remained normal with normal sinus rhythm on the monitor throughout her stay in the emergency department.  Extensive discussion with the patient regarding role of D-dimer/CT  angiogram to rule out PE.  After fluid bolus, patient's heart rate has remained in the 80s to 90s and she is PERC negative at this time.  Overall her history and work-up is very low risk for PE.  Clinically very low suspicion for PE.  After discussion with the patient she expresses his to bypass work-up for PE today which I feel is reasonable.  Given patient was able to ambulate in the emergency department with normal vital signs and reassuring work-up at this time, no further work-up is warranted in the ER.  I was able to contact her cardiologist office and secure her an appointment for 10/20/2021, and she has been provided with a referral to electrophysiology as well.  Patient was evaluated the bedside by attending physician as well who agrees with disposition plan./Voiced understanding of her medical evaluation and treatment plan.  Each of her questions was answered to her expressed satisfaction.  Return precautions were given.  Patient is well-appearing, stable, and was discharged in good condition.  This chart was dictated using voice recognition software, Dragon. Despite the best efforts of this provider to proofread and correct errors, errors may still occur which can change documentation meaning.  Final Clinical Impression(s) / ED Diagnoses Final diagnoses:  Chest pain, unspecified type    Rx / DC Orders ED Discharge Orders          Ordered    Ambulatory referral to Cardiac Electrophysiology       Comments: History of tachy-brady dysrhythmia, requesting EP evaluation   10/05/21 1458              Emeline Darling, PA-C 10/05/21 1949    Wyvonnia Dusky, MD 10/08/21 [phone]

## 2021-10-05 NOTE — ED Triage Notes (Signed)
Pt reports she was driving this morning and chest began hurting under left breast and left arm. Reports having a hypertensive crisis 2 weeks ago. EMS was called but did not to ED chose to stay home and get BP down. Reports has episodes of HR in the 30' and then can go into 170's

## 2021-10-05 NOTE — ED Provider Notes (Signed)
31 yo female presenting to the ED with chest pain, onset at 0700 this morning, has sharp pain under left breast that is worse with inspiration and movement, never had this feeling before.  She does have a history of fluctuant blood pressures and tachy-brady dysrhythmia, undergoing evaluation by Dr Ellyn Hack from cardiology.  Per review of external reviews, holter monitor in July 2022 showed HR 49 -161 bpm, average 69 bpm, intermittent episodes of mobitz type 1, rare PAC/PVCs, but no other arrhythmia found.   Here in the ED has had stable sinus rhythm, intermittent mild tachycardia on telemetry, but ECG does not show evidence of emergent arrhythmia or STEMI.  Labs show electrolytes and Cr wnl, trop undetectable, and xray unremarkable per my interpretation (agreed with radiologist).  Patient did have an episode of emesis here and nausea, but reports this happens whenever her symptoms flare up, and has happened in the past.  On exam no focal abdominal ttp to suggest acute intraabdominal inflammation or surgical emergency.  Lungs CTAB, HR regular, no significant murmurs.  She has a low Wells score (0) with her regular HR here.  No acute DVT or PE risk factors or symptoms.  We did discuss Ddimer and further PE workup versus watchful waiting, and she would prefer not to have this workup at this time, which I think is reasonable.  Patient and her mother express frustration for lack of diagnosis and treatment for her underlying condition.  She states she has had difficulty reaching anyone at her cardiology office, leaving callback info with no reply.  Our PA was able to call and confirm a new appointment on Feb 15th with her cardiologist, but the patient and her mother repeatedly asked for an EP referral.  I placed a referral in the system with EP.  I reviewed her CT from Oct 2021 - no report orfadrenal gland mass or adenoma to suggest pheochromocytoma.  Given that she is clinically stable at this time, I advised  outpatient follow up, continuing to drink fluids and water, and return precautions for respiratory distress/increased chest pain (PE symptoms).  Patient and her mother verbalized understanding and agreement with the plan.   Wyvonnia Dusky, MD 10/05/21 [phone]

## 2021-10-05 NOTE — ED Notes (Signed)
Amb around dept with pulse ox 100% o2  HR 89-99

## 2021-10-08 ENCOUNTER — Ambulatory Visit: Payer: BC Managed Care – PPO | Admitting: Internal Medicine

## 2021-10-08 ENCOUNTER — Other Ambulatory Visit: Payer: Self-pay

## 2021-10-08 ENCOUNTER — Encounter: Payer: Self-pay | Admitting: Internal Medicine

## 2021-10-08 VITALS — BP 122/93 | HR 101 | Ht 66.0 in | Wt 197.4 lb

## 2021-10-08 DIAGNOSIS — I479 Paroxysmal tachycardia, unspecified: Secondary | ICD-10-CM | POA: Diagnosis not present

## 2021-10-08 DIAGNOSIS — R001 Bradycardia, unspecified: Secondary | ICD-10-CM | POA: Diagnosis not present

## 2021-10-08 DIAGNOSIS — R42 Dizziness and giddiness: Secondary | ICD-10-CM

## 2021-10-08 NOTE — Patient Instructions (Signed)
Medication Instructions:  Your physician recommends that you continue on your current medications as directed. Please refer to the Current Medication list given to you today.  *If you need a refill on your cardiac medications before your next appointment, please call your pharmacy*   Lab Work: None ordered.  If you have labs (blood work) drawn today and your tests are completely normal, you will receive your results only by: MyChart Message (if you have MyChart) OR A paper copy in the mail If you have any lab test that is abnormal or we need to change your treatment, we will call you to review the results.   Testing/Procedures: None ordered.    Follow-Up: At [facility], you and your health needs are our priority.  As part of our continuing mission to provide you with exceptional heart care, we have created designated Provider Care Teams.  These Care Teams include your primary Cardiologist (physician) and Advanced Practice Providers (APPs -  Physician Assistants and Nurse Practitioners) who all work together to provide you with the care you need, when you need it.  We recommend signing up for the patient portal called "MyChart".  Sign up information is provided on this After Visit Summary.  MyChart is used to connect with patients for Virtual Visits (Telemedicine).  Patients are able to view lab/test results, encounter notes, upcoming appointments, etc.  Non-urgent messages can be sent to your provider as well.   To learn more about what you can do with MyChart, go to [email or web address].    Your next appointment:   Monday, 12/06/2021 at 345pm with Dr Graciela Husbands  If primary card or EP is not listed click here to update    :1}    Other Instructions Recommended salt supplementation.  The goal is about 2 g of sodium, not sodium chloride, a day.  Salt supplements include oral ThermaTabs, buffered sodium preparation, SaltStick Vitassium,  Other options include NUUN.  This comes in pill  form and is dissolved in liquids.  Other liquid preparations include liquid IV, Pedialyte advance care, TRI-oral Also discussed the role of compression wear including thigh sleeves and abdominal binders.  Calf compression is not specifically recommended.Marland Kitchen

## 2021-10-08 NOTE — Progress Notes (Deleted)
ELECTROPHYSIOLOGY CONSULT NOTE  Patient ID: Tanya Gross, MRN: [id number], DOB/AGE: Mar 04, 1991 31 y.o. Admit date: (Not on file) Date of Consult: 10/08/2021  Primary Physician: Jarrett Soho, PA-C Primary Cardiologist: Georgeanna Harrison A Tomey is a 31 y.o. female who is being seen today for the evaluation of *** at the request of ***.    HPI Tanya Gross is a 31 y.o. female ***   Date Cr K Hgb  ***/*** *** *** ***  ***/*** *** *** ***      Past Medical History:  Diagnosis Date   Adult ADHD (attention deficit hyperactivity disorder)    Anxiety    Complication of anesthesia    very anxious and had difficulty catching her breath   Depression    GERD (gastroesophageal reflux disease)    Gestational edema affecting puerperium 10/04/2016   HPV in female    Hx of migraines    Hypertension    on meds prior to pregnancy   Pregnancy induced hypertension       Surgical History:  Past Surgical History:  Procedure Laterality Date   FINGER SURGERY     TONSILLECTOMY Bilateral 07/30/2018   Procedure: TONSILLECTOMY, bilateral;  Surgeon: Suzanna Obey, MD;  Location: [facility] OR;  Service: ENT;  Laterality: Bilateral;   WISDOM TOOTH EXTRACTION       Home Meds: Current Meds  Medication Sig   atomoxetine (STRATTERA) 10 MG capsule Take 10 mg by mouth daily.   sertraline (ZOLOFT) 50 MG tablet Take 75 mg by mouth at bedtime.   TIZANIDINE HCL PO Take by mouth as needed.   zonisamide (ZONEGRAN) 100 MG capsule Take 300 mg by mouth at bedtime.    Allergies:  Allergies  Allergen Reactions   Cefdinir Anaphylaxis and Hives    Social History   Socioeconomic History   Marital status: Married    Spouse name: Not on file   Number of children: 1   Years of education: Not on file   Highest education level: Bachelor's degree (e.g., BA, AB, BS)  Occupational History    Employer: [employer] generation   Occupation: Runner, broadcasting/film/video    Comment: [employer]  Tobacco Use   Smoking  status: Never   Smokeless tobacco: Never  Vaping Use   Vaping Use: Never used  Substance and Sexual Activity   Alcohol use: No   Drug use: No   Sexual activity: Yes  Other Topics Concern   Not on file  Social History Narrative   Married mother of 1.  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   Social Determinants of Health   Financial Resource Strain: Not on file  Food Insecurity: Not on file  Transportation Needs: Not on file  Physical Activity: Not on file  Stress: Not on file  Social Connections: Not on file  Intimate Partner Violence: Not on file     Family History  Problem Relation Age of Onset   Healthy Mother    Thyroid disease Mother    Cerebral palsy Sister    Atrial fibrillation Maternal Grandmother    Stroke Maternal Grandfather    Depression Maternal Aunt      ROS:  Please see the history of present illness.   {ros master:[id number]}  All other systems reviewed and negative.    Physical Exam:*** Blood pressure 110/80, pulse 94, height 5\' 6"  (1.676 m), weight 197 lb 6.4 oz (89.5 kg), last menstrual period 09/24/2021, unknown if currently  breastfeeding. General: Well developed, well nourished female in no acute distress. Head: Normocephalic, atraumatic, sclera non-icteric, no xanthomas, nares are without discharge. EENT: normal  Lymph Nodes:  none Neck: Negative for carotid bruits. JVD not elevated. Back:without scoliosis kyphosis*** Lungs: Clear bilaterally to auscultation without wheezes, rales, or rhonchi. Breathing is unlabored. Heart: RRR with S1 S2. No *** ***/6 systolic*** murmur . No rubs, or gallops appreciated. Abdomen: Soft, non-tender, non-distended with normoactive bowel sounds. No hepatomegaly. No rebound/guarding. No obvious abdominal masses. Msk:  Strength and tone appear normal for age. Extremities: No clubbing or cyanosis. No*** ***+*** edema.  Distal pedal pulses are 2+ and equal bilaterally. Skin: Warm and Dry Neuro: Alert and oriented  X 3. CN III-XII intact Grossly normal sensory and motor function . Psych:  Responds to questions appropriately with a normal affect.        EKG: ***   Assessment and Plan: *** Tanya Gross

## 2021-10-08 NOTE — Progress Notes (Signed)
ELECTROPHYSIOLOGY CONSULT NOTE  Patient ID: Tanya Gross, MRN: [id number], DOB/AGE: July 03, 1991 31 y.o. Admit date: (Not on file) Date of Consult: 10/08/2021  Primary Physician: Jarrett Soho, PA-C Primary Cardiologist: Tanya Gross is a 31 y.o. female who is being seen today for the evaluation of tachycardia at the request of [facility] .    HPI Tanya Gross is a 31 y.o. female referred for tachycardia and bradycardia and dizziness    DATE TEST EF         Date Cr K Hgb  10/21 0.82 3.3 15.5  1/23 0.66 4.1 16.0   Around 5/22 she believed she developed vertigo. At the time she was experiencing room-spinning dizziness and emesis that kept her bedridden for 3 days. Per her smart watch her heart rate seemed to fluctuate between the 200s and dropping to the 30s. When her heart rate drops to the 30s she feels her worst with associated nausea and extreme fatigue.  Since 5/22 she has had 4 severe episodes (the "spin"), longest was 3 days. Usually, her other episodes (the "rush") have been shorter without the nausea, dizziness, or emesis. She describes her dizziness as "similar to the tea cups ride at [facility]" and that she is still. While lying down, she closes her eyes, and her dizziness subsides somewhat. I vertiginous sensation recurs with rotation of the head   Two weeks ago while at work her heart rate again spiked to the 200s, and she again suffered from room-spinning dizziness and emesis. EMS found her to be in hypertensive crisis, she did not go to the ED. After ten hours her symptoms resolved.    The "rush" usually occurs 3-4 times a week, and this frequency has been stable in the past few months.she suddenly feels a "rush to her head."  If she stops and waits/rests, her "rush" will last for about 10 minutes. She has been told that her face typically shows pallor, her breathing changes noticeably, and she becomes irritable. She also becomes diaphoretic. She  feels that the "rush" becomes worse while standing. Following her episodes, she feels drained with no energy for the rest of the day. Generally she returns to baseline the next morning.     She began having severe chest pain a few days ago. She presented to the ED 10/05/2021.  This episode started as a "rush" and progressed to a "spin."  Generally she avoids showers, as she will feel "overheated like she ran a marathon". If the water is too warm she becomes dizzy upon standing out of the bathtub.  She can fall asleep easily, including while driving or while at her desk. She was tested for sleep apnea 2-3 years ago, which was reportedly negative. This was an at-home test. Until her sleepiness began prior to her pregnancy she used to be a runner. She does not consider herself to be fit at this time.     The patient denies shortness of breath, nocturnal dyspnea, orthopnea or peripheral edema.  There have been no palpitations, or syncope.    She has been infected with COVID twice, in 11/2019 and again in 08/2020.    Past Medical History:  Diagnosis Date   Adult ADHD (attention deficit hyperactivity disorder)    Anxiety    Complication of anesthesia    very anxious and had difficulty catching her breath   Depression    GERD (gastroesophageal reflux disease)    Gestational edema affecting  puerperium 10/04/2016   HPV in female    Hx of migraines    Hypertension    on meds prior to pregnancy   Pregnancy induced hypertension       Surgical History:  Past Surgical History:  Procedure Laterality Date   FINGER SURGERY     TONSILLECTOMY Bilateral 07/30/2018   Procedure: TONSILLECTOMY, bilateral;  Surgeon: Suzanna ObeyByers, John, MD;  Location: [facility] OR;  Service: ENT;  Laterality: Bilateral;   WISDOM TOOTH EXTRACTION       Home Meds: Current Meds  Medication Sig   atomoxetine (STRATTERA) 10 MG capsule Take 10 mg by mouth daily.   sertraline (ZOLOFT) 50 MG tablet Take 75 mg by mouth at bedtime.    TIZANIDINE HCL PO Take by mouth as needed.   zonisamide (ZONEGRAN) 100 MG capsule Take 300 mg by mouth at bedtime.    Allergies:  Allergies  Allergen Reactions   Cefdinir Anaphylaxis and Hives    Social History   Socioeconomic History   Marital status: Married    Spouse name: Not on file   Number of children: 1   Years of education: Not on file   Highest education level: Bachelor's degree (e.g., BA, AB, BS)  Occupational History    Employer: next generation   Occupation: Runner, broadcasting/film/videoTeacher    Comment: [employer] school  Tobacco Use   Smoking status: Never   Smokeless tobacco: Never  Vaping Use   Vaping Use: Never used  Substance and Sexual Activity   Alcohol use: No   Drug use: No   Sexual activity: Yes  Other Topics Concern   Not on file  Social History Narrative   Married mother of 1.  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   Social Determinants of Health   Financial Resource Strain: Not on file  Food Insecurity: Not on file  Transportation Needs: Not on file  Physical Activity: Not on file  Stress: Not on file  Social Connections: Not on file  Intimate Partner Violence: Not on file     Family History  Problem Relation Age of Onset   Healthy Mother    Thyroid disease Mother    Cerebral palsy Sister    Atrial fibrillation Maternal Grandmother    Stroke Maternal Grandfather    Depression Maternal Aunt      ROS:  Please see the history of present illness.     All other systems reviewed and negative.    Physical Exam:  Blood pressure (!) 122/93, pulse (!) 101, height 5\' 6"  (1.676 m), weight 197 lb 6.4 oz (89.5 kg), last menstrual period 09/24/2021, unknown if currently breastfeeding. General: Well developed, well nourished female in no acute distress. Head: Normocephalic, atraumatic, sclera non-icteric, no xanthomas, nares are without discharge. EENT: normal  Lymph Nodes:  none Neck: Negative for carotid bruits. JVD not elevated. Back:without scoliosis  kyphosis  Lungs: Clear bilaterally to auscultation without wheezes, rales, or rhonchi. Breathing is unlabored. Heart: RRR with S1 S2 no murmur . No rubs, or gallops appreciated. Abdomen: Soft, non-tender, non-distended with normoactive bowel sounds. No hepatomegaly. No rebound/guarding. No obvious abdominal masses. Msk:  Strength and tone appear normal for age. Extremities: No clubbing or cyanosis. No  edema.  Distal pedal pulses are 2+ and equal bilaterally. Skin: Warm and Dry Neuro: Alert and oriented X 3. CN III-XII intact Grossly normal sensory and motor function . Psych:  Responds to questions appropriately with a normal affect.  EKG: Sinus at 89 Interval 14/09/36 Low voltage  Event recorder was reviewed.  Dizziness associated with sinus tachycardia.  There was one episode of Mobitz 1 second-degree AV block occurring at 1625 hrs.   Assessment and Plan:  Vertigo  Orthostatic intolerance  Sleep disturbance  Second-degree AV block  There are at least 2 different symptom complexes, the first is most consistent with vertigo.  In this regard I have suggested that she seek from her primary care physician and ENT/neuro evaluation.  She could be instructed in the Epley maneuvers so as to be able to control this part of her symptom complex.  Is not clear to me why her heart rate goes up so fast with this.  There may be a way to use her Apple Watch to record these events.  Her other episodes suggest dysautonomia with provocation with prolonged standing or other things which would be associated with changes in venous return. We discussed extensively the issues of dysautonomia, the physiology of orthstasis and positional stress.  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       Tanya Gross    I, Sherryl Manges, MD, have reviewed all documentation for this visit. The documentation on 10/08/21 for the exam, diagnosis, procedures, and orders are all accurate and complete.

## 2021-10-18 ENCOUNTER — Encounter: Payer: Self-pay | Admitting: Internal Medicine

## 2021-10-20 ENCOUNTER — Ambulatory Visit: Payer: BC Managed Care – PPO | Admitting: Cardiology

## 2021-11-25 NOTE — Telephone Encounter (Signed)
Pt scheduled to see Dr Graciela Husbands on 12/06/2021. ?

## 2021-12-06 ENCOUNTER — Encounter: Payer: Self-pay | Admitting: Internal Medicine

## 2021-12-06 ENCOUNTER — Ambulatory Visit: Payer: BC Managed Care – PPO | Admitting: Internal Medicine

## 2021-12-06 VITALS — BP 124/86 | HR 93 | Ht 66.0 in | Wt 204.0 lb

## 2021-12-06 DIAGNOSIS — I951 Orthostatic hypotension: Secondary | ICD-10-CM | POA: Diagnosis not present

## 2021-12-06 DIAGNOSIS — G479 Sleep disorder, unspecified: Secondary | ICD-10-CM | POA: Diagnosis not present

## 2021-12-06 DIAGNOSIS — R42 Dizziness and giddiness: Secondary | ICD-10-CM | POA: Diagnosis not present

## 2021-12-06 DIAGNOSIS — I441 Atrioventricular block, second degree: Secondary | ICD-10-CM | POA: Diagnosis not present

## 2021-12-06 MED ORDER — TRAZODONE HCL 50 MG PO TABS: 25.0000 mg | ORAL_TABLET | Freq: Every day | ORAL | 3 refills | Status: AC

## 2021-12-06 NOTE — Progress Notes (Signed)
? ? ? ? ?Patient Care Team: ?Jarrett Soho, PA-C as PCP - General (Family Medicine) ? ? ?HPI ? ?Tanya Gross (FN) is a 31 y.o. female seen in follow-up for symptoms suggestive of both vertigo as well as dysautonomia with orthostatic intolerance.  She has documented Mobitz 1 second-degree AV block  ? ?She has had 1 interval episode of syncope, this came without warning.  It turned out to occur in the context of her menses, in association that she had not appreciated prior to her last visit.  She has a history of menometrorrhagia ? ?She has been using a new electrolyte formula called normalyte which has been significantly beneficial.  She has had no subsequent syncope.  She notes that her syncope often comes without warning. ? ?She remains significantly heat intolerance. ? ?She is using compressive wear to good advantage, she has found that a 1 piece is more effective ? ?She asks about going to dialysis pending $8000? ? ?Notes that her fatigue is relentless and she is sleeping at her desk periodically.  She had a sleep study that was negative.  Dr. Allie Dimmer. ? ?  ?DATE TEST EF    ?         ?  ?Date Cr K Hgb  ?10/21 0.82 3.3 15.5  ?1/23 0.66 4.1 16.0  ?   ?Records and Results Reviewed  ? ?Past Medical History:  ?Diagnosis Date  ? Adult ADHD (attention deficit hyperactivity disorder)   ? Anxiety   ? Complication of anesthesia   ? very anxious and had difficulty catching her breath  ? Depression   ? GERD (gastroesophageal reflux disease)   ? Gestational edema affecting puerperium 10/04/2016  ? HPV in female   ? Hx of migraines   ? Hypertension   ? on meds prior to pregnancy  ? Pregnancy induced hypertension   ? ? ?Past Surgical History:  ?Procedure Laterality Date  ? FINGER SURGERY    ? TONSILLECTOMY Bilateral 07/30/2018  ? Procedure: TONSILLECTOMY, bilateral;  Surgeon: Suzanna Obey, MD;  Location: [facility] OR;  Service: ENT;  Laterality: Bilateral;  ? WISDOM TOOTH EXTRACTION    ? ? ?Current Meds  ?Medication Sig  ?  atomoxetine (STRATTERA) 10 MG capsule Take 10 mg by mouth daily.  ? fluconazole (DIFLUCAN) 150 MG tablet as needed. Yeast infection  ? perphenazine (TRILAFON) 4 MG tablet Take by mouth as needed for migraine.  ? sertraline (ZOLOFT) 100 MG tablet Take 100 mg by mouth daily.  ? TIZANIDINE HCL PO Take by mouth as needed.  ? zonisamide (ZONEGRAN) 100 MG capsule Take 300 mg by mouth at bedtime.  ? [DISCONTINUED] sertraline (ZOLOFT) 50 MG tablet Take 75 mg by mouth at bedtime.  ? ? ?Allergies  ?Allergen Reactions  ? Cefdinir Anaphylaxis and Hives  ? Cephalosporins   ? ? ? ? ?Review of Systems negative except from HPI and PMH ? ?Physical Exam ?BP 124/86   Pulse 93   Ht 5\' 6"  (1.676 m)   Wt 204 lb (92.5 kg)   SpO2 100%   BMI 32.93 kg/m?  ?Well developed and well nourished in no acute distress ?HENT normal ?E scleral and icterus clear ?Neck Supple ?JVP flat; carotids brisk and full ?Clear to ausculation ?Regular rate and rhythm, no murmurs gallops or rub ?Soft with active bowel sounds ?No clubbing cyanosis  Edema ?Alert and oriented, grossly normal motor and sensory function ?Skin Warm and Dry ? ?ECG   ? ?CrCl cannot be calculated (Patient's most  recent lab result is older than the maximum 21 days allowed.). ? ? ?Assessment and  Plan ?Vertigo ?  ?Orthostatic intolerance ?  ?Sleep disturbance ?  ?Second-degree AV block ? ?Menometrorrhagia ? ?Polycythemia ? ?Fatigue ? ? ?She has symptoms consistent with dysautonomia despite objective data and supporting this diagnosis either on today's visit or at the last visit.  Moreover, she has responded to some of the therapies including salt and water.  She felt great with IV hydration.  We again discussed the importance of salt water repletion.  Have encouraged her to increase her sodium intake to 2-3 g and to take glucose in some form to promote sodium absorption, perhaps a glass of apple juice.  She asked about a glucose tablet I said that would be fine. ? ?I am perplexed at her  hemoglobin in this context of her history of significant bleeding during her menses.  I have reached out to hematology for guidance as to how to further pursue this.  In the absence of an abnormal sleep study, there is no reason why she should be hypoxic.  Her O2 sat in the office today was 100%. ? 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  She sleeps poorly and talking to Dr. Darrick Penna about other such patients, have recommended that we try a low-dose sleep aid, 25 mg of trazodone, in addition to melatonin 10% to see if it can promote sleep and his hypothesis of improved muscle glucose uptake while sleeping. ? ? ? ? ? ?Current medicines are reviewed at length with the patient today .  The patient does not  have concerns regarding medicines. ? ?

## 2021-12-06 NOTE — Patient Instructions (Addendum)
Medication Instructions:  ?Your physician has recommended you make the following change in your medication:  ? ? START taking trazodone 50 mg-  Take 1/2 tablet (25 mg) tablet by mouth daily at bedtime ? ?2.    You may take melatonin at bedtime-5 or 10 mg ? ?Labwork: ?None ordered. ? ?Testing/Procedures: ?None ordered. ? ?Follow-Up: ?Your physician wants you to follow-up in: 12 weeks with Dr. Graciela Husbands  ? ?Any Other Special Instructions Will Be Listed Below (If Applicable). ? ?If you need a refill on your cardiac medications before your next appointment, please call your pharmacy.  ? ?Trazodone Tablets ?What is this medication? ?TRAZODONE (TRAZ oh done) treats depression. It increases the amount of serotonin in the brain, a hormone that helps regulate mood. ?This medicine may be used for other purposes; ask your health care provider or pharmacist if you have questions. ?COMMON BRAND NAME(S): Desyrel ?What should I tell my care team before I take this medication? ?They need to know if you have any of these conditions: ?Attempted suicide or thinking about it ?Bipolar disorder ?Bleeding problems ?Glaucoma ?Heart disease, or previous heart attack ?Irregular heart beat ?Kidney or liver disease ?Low levels of sodium in the blood ?An unusual or allergic reaction to trazodone, other medications, foods, dyes or preservatives ?Pregnant or trying to get pregnant ?Breast-feeding ?How should I use this medication? ?Take this medication by mouth with a glass of water. Follow the directions on the prescription label. Take this medication shortly after a meal or a light snack. Take your medication at regular intervals. Do not take your medication more often than directed. Do not stop taking this medication suddenly except upon the advice of your care team. Stopping this medication too quickly may cause serious side effects or your condition may worsen. ?A special MedGuide will be given to you by the pharmacist with each prescription and  refill. Be sure to read this information carefully each time. ?Talk to your care team regarding the use of this medication in children. Special care may be needed. ?Overdosage: If you think you have taken too much of this medicine contact a poison control center or emergency room at once. ?NOTE: This medicine is only for you. Do not share this medicine with others. ?What if I miss a dose? ?If you miss a dose, take it as soon as you can. If it is almost time for your next dose, take only that dose. Do not take double or extra doses. ?What may interact with this medication? ?Do not take this medication with any of the following: ?Certain medications for fungal infections like fluconazole, itraconazole, ketoconazole, posaconazole, voriconazole ?Cisapride ?Dronedarone ?Linezolid ?MAOIs like Carbex, Eldepryl, Marplan, Nardil, and Parnate ?Mesoridazine ?Methylene blue (injected into a vein) ?Pimozide ?Saquinavir ?Thioridazine ?This medication may also interact with the following: ?Alcohol ?Antiviral medications for HIV or AIDS ?Aspirin and aspirin-like medications ?Barbiturates like phenobarbital ?Certain medications for blood pressure, heart disease, irregular heart beat ?Certain medications for depression, anxiety, or psychotic disturbances ?Certain medications for migraine headache like almotriptan, eletriptan, frovatriptan, naratriptan, rizatriptan, sumatriptan, zolmitriptan ?Certain medications for seizures like carbamazepine and phenytoin ?Certain medications for sleep ?Certain medications that treat or prevent blood clots like dalteparin, enoxaparin, warfarin ?Digoxin ?Fentanyl ?Lithium ?NSAIDS, medications for pain and inflammation, like ibuprofen or naproxen ?Other medications that prolong the QT interval (cause an abnormal heart rhythm) like dofetilide ?Rasagiline ?Supplements like St. John's wort, kava kava, valerian ?Tramadol ?Tryptophan ?This list may not describe all possible interactions. Give your health  care  provider a list of all the medicines, herbs, non-prescription drugs, or dietary supplements you use. Also tell them if you smoke, drink alcohol, or use illegal drugs. Some items may interact with your medicine. ?What should I watch for while using this medication? ?Tell your care team if your symptoms do not get better or if they get worse. Visit your care team for regular checks on your progress. Because it may take several weeks to see the full effects of this medication, it is important to continue your treatment as prescribed by your care team. ?Watch for new or worsening thoughts of suicide or depression. This includes sudden changes in mood, behaviors, or thoughts. These changes can happen at any time but are more common in the beginning of treatment or after a change in dose. Call your care team right away if you experience these thoughts or worsening depression. ?Manic episodes may happen in patients with bipolar disorder who take this medication. Watch for changes in feelings or behaviors such as feeling anxious, nervous, agitated, panicky, irritable, hostile, aggressive, impulsive, severely restless, overly excited and hyperactive, or trouble sleeping. These changes can happen at any time but are more common in the beginning of treatment or after a change in dose. Call your care team right away if you notice any of these symptoms. ?You may get drowsy or dizzy. Do not drive, use machinery, or do anything that needs mental alertness until you know how this medication affects you. Do not stand or sit up quickly, especially if you are an older patient. This reduces the risk of dizzy or fainting spells. Alcohol may interfere with the effect of this medication. Avoid alcoholic drinks. ?This medication may cause dry eyes and blurred vision. If you wear contact lenses you may feel some discomfort. Lubricating drops may help. See your eye doctor if the problem does not go away or is severe. ?Your mouth may get  dry. Chewing sugarless gum, sucking hard candy and drinking plenty of water may help. Contact your care team if the problem does not go away or is severe. ?What side effects may I notice from receiving this medication? ?Side effects that you should report to your care team as soon as possible: ?Allergic reactions--skin rash, itching, hives, swelling of the face, lips, tongue, or throat ?Bleeding--bloody or black, tar-like stools, red or dark brown urine, vomiting blood or brown material that looks like coffee grounds, small, red or purple spots on skin, unusual bleeding or bruising ?Heart rhythm changes--fast or irregular heartbeat, dizziness, feeling faint or lightheaded, chest pain, trouble breathing ?Low blood pressure--dizziness, feeling faint or lightheaded, blurry vision ?Low sodium level--muscle weakness, fatigue, dizziness, headache, confusion ?Prolonged or painful erection ?Serotonin syndrome--irritability, confusion, fast or irregular heartbeat, muscle stiffness, twitching muscles, sweating, high fever, seizures, chills, vomiting, diarrhea ?Sudden eye pain or change in vision such as blurry vision, seeing halos around lights, vision loss ?Thoughts of suicide or self-harm, worsening mood, feelings of depression ?Side effects that usually do not require medical attention (report to your care team if they continue or are bothersome): ?Change in sex drive or performance ?Constipation ?Dizziness ?Drowsiness ?Dry mouth ?This list may not describe all possible side effects. Call your doctor for medical advice about side effects. You may report side effects to FDA at 1-800-FDA-1088. ?Where should I keep my medication? ?Keep out of the reach of children and pets. ?Store at room temperature between 15 and 30 degrees C (59 to 86 degrees F). Protect from light. Keep container tightly  closed. Throw away any unused medication after the expiration date. ?NOTE: This sheet is a summary. It may not cover all possible  information. If you have questions about this medicine, talk to your doctor, pharmacist, or health care provider. ?? 2022 Elsevier/Gold Standard (2020-08-12 00:00:00) ? ? ? ?

## 2021-12-07 ENCOUNTER — Telehealth: Payer: Self-pay

## 2021-12-07 DIAGNOSIS — R718 Other abnormality of red blood cells: Secondary | ICD-10-CM

## 2021-12-07 DIAGNOSIS — D582 Other hemoglobinopathies: Secondary | ICD-10-CM

## 2021-12-07 NOTE — Telephone Encounter (Signed)
Attempted phone call to pt.  Per Epic ok to leave detailed voicemail message.  Pt advised Dr Graciela Husbands has reviewed her labs which show an elevated hgb and elevated RBC.  Dr Graciela Husbands has discussed with hematology and requests that additional studies be completed of Iron,TIBC and Ferritin level, Erythropoietin and JAK2 mutation study.  Requested pt contact [phone] to schedule an appointment at Dr Odessa Fleming office for these additional labs and  may reach RN at this same number to discuss further if needed.  Pt may also reach RN through MyChart.  ?

## 2021-12-08 NOTE — Telephone Encounter (Signed)
Attempted phone call to pt and left voicemail to contact RN at [phone]. 

## 2021-12-14 NOTE — Addendum Note (Signed)
Addended by: Alois Cliche on: 12/14/2021 05:35 PM ? ? Modules accepted: Orders ? ?

## 2022-01-26 ENCOUNTER — Other Ambulatory Visit: Payer: BC Managed Care – PPO | Admitting: *Deleted

## 2022-01-26 DIAGNOSIS — D582 Other hemoglobinopathies: Secondary | ICD-10-CM

## 2022-01-26 DIAGNOSIS — R718 Other abnormality of red blood cells: Secondary | ICD-10-CM

## 2022-01-27 ENCOUNTER — Encounter: Payer: Self-pay | Admitting: Internal Medicine

## 2022-01-27 LAB — IRON,TIBC AND FERRITIN PANEL
Ferritin: 135 ng/mL (ref 15–150)
Iron Saturation: 33 % (ref 15–55)
Iron: 83 ug/dL (ref 27–159)
Total Iron Binding Capacity: 251 ug/dL (ref 250–450)
UIBC: 168 ug/dL (ref 131–425)

## 2022-01-27 LAB — ERYTHROPOIETIN: Erythropoietin: 4.2 m[IU]/mL (ref 2.6–18.5)

## 2022-02-03 LAB — NGS JAK2 EXONS 12-15

## 2022-03-17 ENCOUNTER — Ambulatory Visit: Payer: BC Managed Care – PPO | Admitting: Internal Medicine

## 2022-03-18 ENCOUNTER — Encounter: Payer: Self-pay | Admitting: Internal Medicine

## 2022-05-12 ENCOUNTER — Encounter: Payer: Self-pay | Admitting: Internal Medicine

## 2022-05-12 NOTE — Telephone Encounter (Signed)
I think using a stimulant is fraught with peril.  I would not be inclined to pursue this.  Maybe they can come up with something different.  Good luck with all of this.

## 2023-02-13 DIAGNOSIS — F418 Other specified anxiety disorders: Secondary | ICD-10-CM | POA: Diagnosis not present

## 2023-02-13 DIAGNOSIS — G90A Postural orthostatic tachycardia syndrome (POTS): Secondary | ICD-10-CM | POA: Diagnosis not present

## 2023-02-13 DIAGNOSIS — Z23 Encounter for immunization: Secondary | ICD-10-CM | POA: Diagnosis not present

## 2023-05-10 ENCOUNTER — Other Ambulatory Visit: Payer: Self-pay

## 2023-05-10 ENCOUNTER — Emergency Department (HOSPITAL_BASED_OUTPATIENT_CLINIC_OR_DEPARTMENT_OTHER)
Admission: EM | Admit: 2023-05-10 | Discharge: 2023-05-10 | Disposition: A | Discharge status: Home / Self Care | Payer: BC Managed Care – PPO | Attending: Emergency Medicine | Admitting: Emergency Medicine

## 2023-05-10 DIAGNOSIS — E860 Dehydration: Secondary | ICD-10-CM | POA: Insufficient documentation

## 2023-05-10 DIAGNOSIS — G90A Postural orthostatic tachycardia syndrome (POTS): Secondary | ICD-10-CM | POA: Diagnosis not present

## 2023-05-10 DIAGNOSIS — R42 Dizziness and giddiness: Secondary | ICD-10-CM | POA: Diagnosis not present

## 2023-05-10 LAB — COMPREHENSIVE METABOLIC PANEL
ALT: 19 U/L (ref 0–44)
AST: 16 U/L (ref 15–41)
Albumin: 4.1 g/dL (ref 3.5–5.0)
Alkaline Phosphatase: 45 U/L (ref 38–126)
Anion gap: 7 (ref 5–15)
BUN: 12 mg/dL (ref 6–20)
CO2: 25 mmol/L (ref 22–32)
Calcium: 8.7 mg/dL — ABNORMAL LOW (ref 8.9–10.3)
Chloride: 105 mmol/L (ref 98–111)
Creatinine, Ser: 0.84 mg/dL (ref 0.44–1.00)
GFR, Estimated: >60 mL/min (ref >60)
Glucose, Bld: 90 mg/dL (ref 70–99)
Potassium: 4 mmol/L (ref 3.5–5.1)
Sodium: 137 mmol/L (ref 135–145)
Total Bilirubin: 0.5 mg/dL (ref 0.3–1.2)
Total Protein: 6.8 g/dL (ref 6.5–8.1)

## 2023-05-10 LAB — CBC WITH DIFFERENTIAL/PLATELET
Abs Immature Granulocytes: 0.01 10*3/uL (ref 0.00–0.07)
Basophils Absolute: 0 10*3/uL (ref 0.0–0.1)
Basophils Relative: 1 %
Eosinophils Absolute: 0.1 10*3/uL (ref 0.0–0.5)
Eosinophils Relative: 1 %
HCT: 42.3 % (ref 36.0–46.0)
Hemoglobin: 15.2 g/dL — ABNORMAL HIGH (ref 12.0–15.0)
Immature Granulocytes: 0 %
Lymphocytes Relative: 31 %
Lymphs Abs: 2 10*3/uL (ref 0.7–4.0)
MCH: 30.5 pg (ref 26.0–34.0)
MCHC: 35.9 g/dL (ref 30.0–36.0)
MCV: 84.8 fL (ref 80.0–100.0)
Monocytes Absolute: 0.6 10*3/uL (ref 0.1–1.0)
Monocytes Relative: 9 %
Neutro Abs: 3.8 10*3/uL (ref 1.7–7.7)
Neutrophils Relative %: 58 %
Platelets: 189 10*3/uL (ref 150–400)
RBC: 4.99 MIL/uL (ref 3.87–5.11)
RDW: 12.2 % (ref 11.5–15.5)
WBC: 6.5 10*3/uL (ref 4.0–10.5)
nRBC: 0 % (ref 0.0–0.2)

## 2023-05-10 LAB — LIPASE, BLOOD: Lipase: 26 U/L (ref 11–51)

## 2023-05-10 LAB — HCG, SERUM, QUALITATIVE: Preg, Serum: NEGATIVE

## 2023-05-10 MED ORDER — SODIUM CHLORIDE 0.9 % IV BOLUS
1000.0000 mL | Freq: Once | INTRAVENOUS | Status: AC
  Administered 2023-05-10: 1000 mL via INTRAVENOUS

## 2023-05-10 MED ORDER — ONDANSETRON HCL 4 MG/2ML IJ SOLN
4.0000 mg | Freq: Once | INTRAMUSCULAR | Status: AC
  Administered 2023-05-10: 4 mg via INTRAVENOUS
  Filled 2023-05-10: qty 2

## 2023-05-10 NOTE — ED Notes (Signed)
Pt verbalized understanding of d/c instructions, meds, and followup care. Denies questions. VSS, no distress noted. Steady gait to exit with all belongings.  ?

## 2023-05-10 NOTE — ED Provider Notes (Signed)
[place] EMERGENCY DEPARTMENT AT [facility] Provider Note   CSN: [id number] Arrival date & time: 05/10/23  1340     History  Chief Complaint  Patient presents with   Dizziness    Tanya Gross is a 32 y.o. female.  Patient is a 32 yo female with pmh of POTS presenting for headache, nausea, vomiting, and 1 episode of LOC prior to arrival. Denies any injuries from LOC and fall. States these are typical symptoms for POTS. Requesting hydration. No blood thinner use. No current pregnancy.   The history is provided by the patient. No language interpreter was used.  Dizziness Associated symptoms: headaches, nausea and vomiting   Associated symptoms: no chest pain, no palpitations and no shortness of breath        Home Medications Prior to Admission medications   Medication Sig Start Date End Date Taking? Authorizing Provider  atomoxetine (STRATTERA) 10 MG capsule Take 10 mg by mouth daily.    [provider]  fluconazole (DIFLUCAN) 150 MG tablet as needed. Yeast infection    [provider]  perphenazine (TRILAFON) 4 MG tablet Take by mouth as needed for migraine. 09/27/21   [provider]  sertraline (ZOLOFT) 100 MG tablet Take 100 mg by mouth daily. 10/07/21   [provider]  TIZANIDINE HCL PO Take by mouth as needed.    [provider]  traZODone (DESYREL) 50 MG tablet Take 0.5 tablets (25 mg total) by mouth at bedtime. 12/06/21   Duke Salvia, MD  zonisamide (ZONEGRAN) 100 MG capsule Take 300 mg by mouth at bedtime.    [provider]      Allergies    Cefdinir and Cephalosporins    Review of Systems   Review of Systems  Constitutional:  Negative for chills and fever.  HENT:  Negative for ear pain and sore throat.   Eyes:  Negative for pain and visual disturbance.  Respiratory:  Negative for cough and shortness of breath.   Cardiovascular:  Negative for chest pain and palpitations.  Gastrointestinal:   Positive for nausea and vomiting. Negative for abdominal pain.  Genitourinary:  Negative for dysuria and hematuria.  Musculoskeletal:  Negative for arthralgias and back pain.  Skin:  Negative for color change and rash.  Neurological:  Positive for dizziness and headaches. Negative for seizures and syncope.  All other systems reviewed and are negative.   Physical Exam Updated Vital Signs BP (!) 145/94 (BP Location: Left Arm)   Pulse (!) 108   Temp 98.2 F (36.8 C) (Oral)   Resp 19   LMP 05/10/2023 (Exact Date)   SpO2 97%  Physical Exam Vitals and nursing note reviewed.  Constitutional:      General: She is not in acute distress.    Appearance: She is well-developed.  HENT:     Head: Normocephalic and atraumatic.  Eyes:     Conjunctiva/sclera: Conjunctivae normal.  Cardiovascular:     Rate and Rhythm: Normal rate and regular rhythm.     Heart sounds: No murmur heard. Pulmonary:     Effort: Pulmonary effort is normal. No respiratory distress.     Breath sounds: Normal breath sounds.  Abdominal:     Palpations: Abdomen is soft.     Tenderness: There is no abdominal tenderness.  Musculoskeletal:        General: No swelling.     Cervical back: Neck supple.  Skin:    General: Skin is warm and dry.  Capillary Refill: Capillary refill takes less than 2 seconds.  Neurological:     Mental Status: She is alert and oriented to person, place, and time.     GCS: GCS eye subscore is 4. GCS verbal subscore is 5. GCS motor subscore is 6.     Cranial Nerves: Cranial nerves 2-12 are intact.     Sensory: Sensation is intact.     Motor: Motor function is intact.     Coordination: Coordination is intact.  Psychiatric:        Mood and Affect: Mood normal.     ED Results / Procedures / Treatments   Labs (all labs ordered are listed, but only abnormal results are displayed) Labs Reviewed  CBC WITH DIFFERENTIAL/PLATELET - Abnormal; Notable for the following components:      Result  Value   Hemoglobin 15.2 (*)    All other components within normal limits  COMPREHENSIVE METABOLIC PANEL  LIPASE, BLOOD  HCG, SERUM, QUALITATIVE    EKG None  Radiology No results found.  Procedures Procedures    Medications Ordered in ED Medications  ondansetron (ZOFRAN) injection 4 mg (has no administration in time range)  sodium chloride 0.9 % bolus 1,000 mL (1,000 mLs Intravenous New Bag/Given 05/10/23 1513)    ED Course/ Medical Decision Making/ A&P                                 Medical Decision Making Amount and/or Complexity of Data Reviewed Labs: ordered.  Risk Prescription drug management.   3:43 PM  32 yo female with pmh of POTS presenting for headache, nausea, vomiting, and 1 episode of LOC prior to arrival. Normal neurovascular exam.   IV fluids ordered, 1L. Labs pending.  Patient signed out to oncoming provider while waiting fluids.         Final Clinical Impression(s) / ED Diagnoses Final diagnoses:  POTS (postural orthostatic tachycardia syndrome)  Dehydration    Rx / DC Orders ED Discharge Orders     None         Franne Forts, DO 05/10/23 1543

## 2023-05-10 NOTE — ED Notes (Signed)
Pt complaint/concern is dehydration due to POTS.

## 2023-05-10 NOTE — ED Provider Notes (Signed)
Pt signed out that workup/labs completed, and that plan is for patient d/c after ivf.   RN indicates iv infiltrated after first 800-900 cc ivf. Prior iv removed by staff.  Current hr 90, and bp normal.   Pt prefers to have new iv/receive remaining fluids/staff aware.   Will also encourage po fluids. No emesis during ~ 4 hours in ED room, so feel oral hydration appropriate as well.      Cathren Laine, MD 05/10/23 1750

## 2023-05-10 NOTE — ED Notes (Signed)
Swelling and redness of IV site reduced, pt reports pain has subsided

## 2023-05-10 NOTE — ED Triage Notes (Addendum)
Pt c/o dizziness and nausea, h/o POTS and reports feeling very dehydrated today.  States her sx today are similar to previous episodes she'd had r/t her POTS.  AAOx4, NAD in triage.

## 2023-05-10 NOTE — Discharge Instructions (Addendum)
It was our pleasure to provide your ER care today - we hope that you feel better.  Drink plenty of fluids/stay well hydrated.   Follow up closely with your doctor in the next few days.   Return to ER if worse, new symptoms, fevers, persistent vomiting, new/severe pain, fainting, or other concern.

## 2023-05-10 NOTE — ED Notes (Signed)
Pt called out stating IV was burning. This RN went to assess, IV infiltrated, IV site red and swollen. Fluids stopped, IV taken out, cool compress applied to area. Cool compress replaced with warm compress. Swelling beginning to decrease. Robin Paramedic made aware

## 2023-05-10 NOTE — ED Notes (Addendum)
Pt called out stating she wanted another IV to finish the fluids ordered. This RN asked Steinl MD if he still wanted to give her the rest of the fluids ( already administered). Per MD remaining fluids can be DC as long as pt is tolerating oral fluids. Pt updated by this RN and requested IV fluids, is fearful that her dizziness from POTS will return if she does not receive full amount. Steinl MD made aware, is to come to bedside to assess pt

## 2023-08-14 DIAGNOSIS — M9903 Segmental and somatic dysfunction of lumbar region: Secondary | ICD-10-CM | POA: Diagnosis not present

## 2023-08-14 DIAGNOSIS — M9905 Segmental and somatic dysfunction of pelvic region: Secondary | ICD-10-CM | POA: Diagnosis not present

## 2023-08-14 DIAGNOSIS — M5116 Intervertebral disc disorders with radiculopathy, lumbar region: Secondary | ICD-10-CM | POA: Diagnosis not present

## 2023-08-14 DIAGNOSIS — M25551 Pain in right hip: Secondary | ICD-10-CM | POA: Diagnosis not present

## 2023-08-21 DIAGNOSIS — M9905 Segmental and somatic dysfunction of pelvic region: Secondary | ICD-10-CM | POA: Diagnosis not present

## 2023-08-21 DIAGNOSIS — M9903 Segmental and somatic dysfunction of lumbar region: Secondary | ICD-10-CM | POA: Diagnosis not present

## 2023-08-21 DIAGNOSIS — M25551 Pain in right hip: Secondary | ICD-10-CM | POA: Diagnosis not present

## 2023-08-21 DIAGNOSIS — M5116 Intervertebral disc disorders with radiculopathy, lumbar region: Secondary | ICD-10-CM | POA: Diagnosis not present

## 2023-09-04 DIAGNOSIS — M9903 Segmental and somatic dysfunction of lumbar region: Secondary | ICD-10-CM | POA: Diagnosis not present

## 2023-09-04 DIAGNOSIS — M25551 Pain in right hip: Secondary | ICD-10-CM | POA: Diagnosis not present

## 2023-09-04 DIAGNOSIS — M5116 Intervertebral disc disorders with radiculopathy, lumbar region: Secondary | ICD-10-CM | POA: Diagnosis not present

## 2023-09-04 DIAGNOSIS — M9905 Segmental and somatic dysfunction of pelvic region: Secondary | ICD-10-CM | POA: Diagnosis not present

## 2023-09-22 DIAGNOSIS — S060XAA Concussion with loss of consciousness status unknown, initial encounter: Secondary | ICD-10-CM | POA: Diagnosis not present

## 2023-12-25 DIAGNOSIS — R509 Fever, unspecified: Secondary | ICD-10-CM | POA: Diagnosis not present

## 2023-12-25 DIAGNOSIS — Z6833 Body mass index (BMI) 33.0-33.9, adult: Secondary | ICD-10-CM | POA: Diagnosis not present

## 2023-12-25 DIAGNOSIS — J029 Acute pharyngitis, unspecified: Secondary | ICD-10-CM | POA: Diagnosis not present
